# Patient Record
Sex: Male | Born: 1990 | Race: Black or African American | Hispanic: No | Smoking: Current every day smoker
Health system: Southern US, Community
[De-identification: ages and names within clinical notes are randomized; demographics above are authoritative.]

## PROBLEM LIST (undated history)

## (undated) DIAGNOSIS — F319 Bipolar disorder, unspecified: Secondary | ICD-10-CM

## (undated) DIAGNOSIS — F209 Schizophrenia, unspecified: Secondary | ICD-10-CM

## (undated) HISTORY — PX: TONSILLECTOMY: SUR1361

---

## 2022-03-17 ENCOUNTER — Emergency Department (HOSPITAL_COMMUNITY): Payer: No Typology Code available for payment source

## 2022-03-17 ENCOUNTER — Inpatient Hospital Stay (HOSPITAL_COMMUNITY)
Admission: EM | Admit: 2022-03-17 | Discharge: 2022-03-20 | DRG: 918 | Disposition: A | Discharge status: Other Acute Inpatient Hospital | Payer: No Typology Code available for payment source | Attending: Internal Medicine | Admitting: Internal Medicine

## 2022-03-17 DIAGNOSIS — T43592A Poisoning by other antipsychotics and neuroleptics, intentional self-harm, initial encounter: Secondary | ICD-10-CM | POA: Diagnosis not present

## 2022-03-17 DIAGNOSIS — E876 Hypokalemia: Secondary | ICD-10-CM | POA: Diagnosis present

## 2022-03-17 DIAGNOSIS — T50902A Poisoning by unspecified drugs, medicaments and biological substances, intentional self-harm, initial encounter: Secondary | ICD-10-CM

## 2022-03-17 DIAGNOSIS — F1910 Other psychoactive substance abuse, uncomplicated: Secondary | ICD-10-CM

## 2022-03-17 DIAGNOSIS — I959 Hypotension, unspecified: Secondary | ICD-10-CM | POA: Diagnosis present

## 2022-03-17 DIAGNOSIS — R4182 Altered mental status, unspecified: Secondary | ICD-10-CM

## 2022-03-17 DIAGNOSIS — T50901A Poisoning by unspecified drugs, medicaments and biological substances, accidental (unintentional), initial encounter: Secondary | ICD-10-CM | POA: Diagnosis not present

## 2022-03-17 DIAGNOSIS — E872 Acidosis, unspecified: Secondary | ICD-10-CM | POA: Diagnosis present

## 2022-03-17 DIAGNOSIS — Z20822 Contact with and (suspected) exposure to covid-19: Secondary | ICD-10-CM | POA: Diagnosis present

## 2022-03-17 DIAGNOSIS — F39 Unspecified mood [affective] disorder: Secondary | ICD-10-CM | POA: Diagnosis present

## 2022-03-17 DIAGNOSIS — R451 Restlessness and agitation: Secondary | ICD-10-CM | POA: Diagnosis present

## 2022-03-17 LAB — CBC WITH DIFFERENTIAL/PLATELET
Abs Immature Granulocytes: 0.02 10*3/uL (ref 0.00–0.07)
Basophils Absolute: 0 10*3/uL (ref 0.0–0.1)
Basophils Relative: 0 %
Eosinophils Absolute: 0 10*3/uL (ref 0.0–0.5)
Eosinophils Relative: 0 %
HCT: 42.9 % (ref 39.0–52.0)
Hemoglobin: 14.8 g/dL (ref 13.0–17.0)
Immature Granulocytes: 0 %
Lymphocytes Relative: 18 %
Lymphs Abs: 1.1 10*3/uL (ref 0.7–4.0)
MCH: 29.9 pg (ref 26.0–34.0)
MCHC: 34.5 g/dL (ref 30.0–36.0)
MCV: 86.7 fL (ref 80.0–100.0)
Monocytes Absolute: 0.4 10*3/uL (ref 0.1–1.0)
Monocytes Relative: 7 %
Neutro Abs: 4.4 10*3/uL (ref 1.7–7.7)
Neutrophils Relative %: 75 %
Platelets: 167 10*3/uL (ref 150–400)
RBC: 4.95 MIL/uL (ref 4.22–5.81)
RDW: 12.4 % (ref 11.5–15.5)
WBC: 6 10*3/uL (ref 4.0–10.5)
nRBC: 0 % (ref 0.0–0.2)

## 2022-03-17 LAB — I-STAT VENOUS BLOOD GAS, ED
Acid-base deficit: 4 mmol/L — ABNORMAL HIGH (ref 0.0–2.0)
Bicarbonate: 19.3 mmol/L — ABNORMAL LOW (ref 20.0–28.0)
Calcium, Ion: 1.13 mmol/L — ABNORMAL LOW (ref 1.15–1.40)
HCT: 45 % (ref 39.0–52.0)
Hemoglobin: 15.3 g/dL (ref 13.0–17.0)
O2 Saturation: 98 %
Potassium: 3.2 mmol/L — ABNORMAL LOW (ref 3.5–5.1)
Sodium: 140 mmol/L (ref 135–145)
TCO2: 20 mmol/L — ABNORMAL LOW (ref 22–32)
pCO2, Ven: 31.5 mmHg — ABNORMAL LOW (ref 44–60)
pH, Ven: 7.396 (ref 7.25–7.43)
pO2, Ven: 103 mmHg — ABNORMAL HIGH (ref 32–45)

## 2022-03-17 LAB — MAGNESIUM: Magnesium: 1.5 mg/dL — ABNORMAL LOW (ref 1.7–2.4)

## 2022-03-17 LAB — ACETAMINOPHEN LEVEL: Acetaminophen (Tylenol), Serum: <10 ug/mL — ABNORMAL LOW (ref 10–30)

## 2022-03-17 LAB — LACTIC ACID, PLASMA: Lactic Acid, Venous: 1.9 mmol/L (ref 0.5–1.9)

## 2022-03-17 LAB — ETHANOL: Alcohol, Ethyl (B): <10 mg/dL (ref <10)

## 2022-03-17 LAB — CBG MONITORING, ED: Glucose-Capillary: 147 mg/dL — ABNORMAL HIGH (ref 70–99)

## 2022-03-17 LAB — SALICYLATE LEVEL: Salicylate Lvl: <7 mg/dL — ABNORMAL LOW (ref 7.0–30.0)

## 2022-03-17 MED ORDER — SODIUM CHLORIDE 0.9 % IV BOLUS
1000.0000 mL | Freq: Once | INTRAVENOUS | Status: AC
  Administered 2022-03-17: 1000 mL via INTRAVENOUS

## 2022-03-17 MED ORDER — NOREPINEPHRINE 4 MG/250ML-% IV SOLN
0.0000 ug/min | INTRAVENOUS | Status: DC
  Administered 2022-03-17: 1.067 ug/min via INTRAVENOUS
  Filled 2022-03-17: qty 250

## 2022-03-17 MED ORDER — MAGNESIUM SULFATE 2 GM/50ML IV SOLN
2.0000 g | Freq: Once | INTRAVENOUS | Status: AC
  Administered 2022-03-17: 2 g via INTRAVENOUS
  Filled 2022-03-17: qty 50

## 2022-03-17 MED ORDER — LORAZEPAM 2 MG/ML IJ SOLN
1.0000 mg | Freq: Once | INTRAMUSCULAR | Status: AC
  Administered 2022-03-17: 1 mg via INTRAVENOUS
  Filled 2022-03-17: qty 1

## 2022-03-17 MED ORDER — SODIUM CHLORIDE 0.9 % IV BOLUS
1000.0000 mL | Freq: Once | INTRAVENOUS | Status: AC
Start: 2022-03-17 — End: 2022-03-18
  Administered 2022-03-17: 1000 mL via INTRAVENOUS

## 2022-03-17 NOTE — ED Triage Notes (Signed)
Pt arrived via [facility] for ingestion/overdose with intent from [place]. Pt called EMS after taking 12 Quetiapine 200 mg tablets with the intent to kill himself. Pt was hypotensive, diaphoretic, tachycardic and altered with intermittent unconsciousness. Pt denies all other drugs/mediation minus smoking marijuana earlier today.  ? ?Poison control contacted and instructed EMS to administer IVF. Pt received NS.  ? ?20g left AC ? ?Hr 140 ?BP 78/42 ?SPO2 97% ?RR 16 ?CBG 156 ? ?

## 2022-03-17 NOTE — ED Provider Notes (Signed)
?[facility] ?Provider Note ? ? ?CSN: [id number] ?Arrival date & time: 03/17/22  2205 ? ?  ? ?History ? ?Chief Complaint  ?Patient presents with  ? Drug Overdose  ? ? ?Rickey Miller is a 31 y.o. male.  Presented to the emergency room with concern for drug overdose.  Patient called out to eat, watch for ingestion.  Patient had reported taking 12 quetiapine 200 mg tablets.  Intent to harm himself.  Patient also had prazosin in his possession though denied taking that.  Hypotensive, tachycardic.  Tachycardia had improved with fluids in route but no improvement in blood pressure.  EKG concerning for sinus tachycardia versus SVT. ? ?History limited due to altered mental status. ? ?HPI ? ?  ? ?Home Medications ?Prior to Admission medications   ?Not on File  ?   ? ?Allergies    ?Patient has no allergy information on record.   ? ?Review of Systems   ?Review of Systems  ?Unable to perform ROS: Mental status change  ? ?Physical Exam ?Updated Vital Signs ?BP (!) 94/51   Pulse (!) 116   Temp (!) 97.2 ?F (36.2 ?C) (Oral)   Resp 20   SpO2 97%  ?Physical Exam ?Constitutional:   ?   Comments: Alternating between lethargic and agitated  ?HENT:  ?   Head: Normocephalic and atraumatic.  ?   Mouth/Throat:  ?   Mouth: Mucous membranes are moist.  ?Eyes:  ?   Extraocular Movements: Extraocular movements intact.  ?   Pupils: Pupils are equal, round, and reactive to light.  ?Cardiovascular:  ?   Rate and Rhythm: Tachycardia present.  ?Pulmonary:  ?   Effort: Pulmonary effort is normal. No respiratory distress.  ?Abdominal:  ?   General: Abdomen is flat.  ?   Palpations: There is no mass.  ?   Tenderness: There is no abdominal tenderness.  ?Musculoskeletal:     ?   General: No deformity or signs of injury.  ?   Cervical back: Normal range of motion.  ?Skin: ?   General: Skin is warm.  ?   Comments: Mildly diaphoretic  ?Neurological:  ?   Comments: Intermittently lethargic and intermittently  agitated, some confused verbal response, he opens eyes spontaneously, moves all 4 extremities  ?Psychiatric:  ?   Comments: Agitated  ? ? ?ED Results / Procedures / Treatments   ?Labs ?(all labs ordered are listed, but only abnormal results are displayed) ?Labs Reviewed  ?I-STAT VENOUS BLOOD GAS, ED - Abnormal; Notable for the following components:  ?    Result Value  ? pCO2, Ven 31.5 (*)   ? pO2, Ven 103 (*)   ? Bicarbonate 19.3 (*)   ? TCO2 20 (*)   ? Acid-base deficit 4.0 (*)   ? Potassium 3.2 (*)   ? Calcium, Ion 1.13 (*)   ? All other components within normal limits  ?CBG MONITORING, ED - Abnormal; Notable for the following components:  ? Glucose-Capillary 147 (*)   ? All other components within normal limits  ?LACTIC ACID, PLASMA  ?LACTIC ACID, PLASMA  ?ACETAMINOPHEN LEVEL  ?SALICYLATE LEVEL  ?RAPID URINE DRUG SCREEN, HOSP PERFORMED  ?URINALYSIS, ROUTINE W REFLEX MICROSCOPIC  ?MAGNESIUM  ?ETHANOL  ?CBC WITH DIFFERENTIAL/PLATELET  ?CBC  ? ? ?EKG ?EKG Interpretation ? ?Date/Time:  Saturday March 17 2022 22:12:30 EDT ?Ventricular Rate:  124 ?PR Interval:  202 ?QRS Duration: 107 ?QT Interval:  365 ?QTC Calculation: 525 ?R Axis:   110 ?  Text Interpretation: Sinus tachycardia Borderline prolonged PR interval Consider right atrial enlargement Low voltage, precordial leads Probable right ventricular hypertrophy Nonspecific T abnormalities, anterior leads Prolonged QT interval Confirmed by Marianna Fuss ([id number]) on 03/17/2022 10:45:07 PM ? ?Radiology ?DG Chest Portable 1 View ? ?Result Date: 03/17/2022 ?CLINICAL DATA:  Hypotension EXAM: PORTABLE CHEST 1 VIEW COMPARISON:  None. FINDINGS: The heart size and mediastinal contours are within normal limits. Both lungs are clear. The visualized skeletal structures are unremarkable. IMPRESSION: No active disease. Electronically Signed   By: Sharlet Salina M.D.   On: 03/17/2022 22:28   ? ?Procedures ?[facility] KitchenCritical Care ?Performed by: Milagros Loll, MD ?Authorized by: Milagros Loll, MD  ? ?Critical care provider statement:  ?  Critical care time (minutes):  46 ?  Critical care was necessary to treat or prevent imminent or life-threatening deterioration of the following conditions:  Cardiac failure and CNS failure or compromise ?  Critical care was time spent personally by me on the following activities:  Development of treatment plan with patient or surrogate, discussions with consultants, evaluation of patient's response to treatment, examination of patient, ordering and review of laboratory studies, ordering and review of radiographic studies, ordering and performing treatments and interventions, pulse oximetry, re-evaluation of patient's condition and review of old charts  ? ? ?Medications Ordered in ED ?Medications  ?norepinephrine (LEVOPHED) 4mg  in (0.016 mg/mL) premix infusion (1.067 mcg/min Intravenous New Bag/Given 03/17/22 2224)  ?magnesium sulfate IVPB 2 g 50 mL (2 g Intravenous New Bag/Given 03/17/22 2228)  ?sodium chloride 0.9 % bolus 1,000 mL (has no administration in time range)  ?sodium chloride 0.9 % bolus 1,000 mL (has no administration in time range)  ?sodium chloride 0.9 % bolus 1,000 mL (1,000 mLs Intravenous New Bag/Given 03/17/22 2217)  ?LORazepam (ATIVAN) injection 1 mg (1 mg Intravenous Given 03/17/22 2226)  ? ? ?ED Course/ Medical Decision Making/ A&P ?  ?                        ?Medical Decision Making ?Amount and/or Complexity of Data Reviewed ?Labs: ordered. ?Radiology: ordered. ?ECG/medicine tests: ordered. ? ?Risk ?Prescription drug management. ?Decision regarding hospitalization. ? ? ?31 year old presenting to ER from [facility] due to concern for quetiapine overdose.  Also had prazosin in position.  On arrival to ER patient tachycardic, hypotensive.  EKG with sinus tachycardia, prolonged QT.  I discussed the case with poison control, they advise supportive measures, fluid resuscitation, pressor support if needed.  They do not recommend charcoal at  this time.  Provided IV mag due to prolonged QT.  Provided 2 L fluid bolus in addition to the 500 mL fluids given by EMS.  Patient's blood pressure was not responding to the initial fluid bolus and initiated low-dose Levophed for pressor support.  Patient is alternating between being somewhat lethargic and very agitated.  Provided IV Ativan. ? ?Initial labs reviewed, slightly low bicarbonate level, CBG okay.  Independently reviewed CXR, no acute findings. ? ?On reassessment, BP has improved, asked RN to pause Levophed.  BP remained stable without Levophed.  While awaiting additional labs, reassessment, signed out to [facility].  If patient has any further hypotension and requires additional pressor support or if there is deterioration of mental status, may require ICU admission.  If not, potentially appropriate for medicine service. ? ? ? ? ? ? ?Final Clinical Impression(s) / ED Diagnoses ?Final diagnoses:  ?Intentional overdose, initial encounter [facility])  ?Hypotension, unspecified hypotension  type  ?Altered mental status, unspecified altered mental status type  ? ? ?Rx / DC Orders ?ED Discharge Orders   ? ? None  ? ?  ? ? ?  ?Milagros Loll, MD ?03/17/22 2312 ? ?

## 2022-03-18 ENCOUNTER — Emergency Department (HOSPITAL_COMMUNITY): Payer: No Typology Code available for payment source

## 2022-03-18 DIAGNOSIS — E872 Acidosis, unspecified: Secondary | ICD-10-CM

## 2022-03-18 DIAGNOSIS — T43592A Poisoning by other antipsychotics and neuroleptics, intentional self-harm, initial encounter: Secondary | ICD-10-CM | POA: Diagnosis not present

## 2022-03-18 DIAGNOSIS — T50902A Poisoning by unspecified drugs, medicaments and biological substances, intentional self-harm, initial encounter: Secondary | ICD-10-CM

## 2022-03-18 DIAGNOSIS — E876 Hypokalemia: Secondary | ICD-10-CM | POA: Diagnosis not present

## 2022-03-18 DIAGNOSIS — I959 Hypotension, unspecified: Secondary | ICD-10-CM | POA: Diagnosis not present

## 2022-03-18 DIAGNOSIS — R451 Restlessness and agitation: Secondary | ICD-10-CM | POA: Diagnosis not present

## 2022-03-18 DIAGNOSIS — Z20822 Contact with and (suspected) exposure to covid-19: Secondary | ICD-10-CM | POA: Diagnosis not present

## 2022-03-18 DIAGNOSIS — F39 Unspecified mood [affective] disorder: Secondary | ICD-10-CM | POA: Diagnosis not present

## 2022-03-18 DIAGNOSIS — F1910 Other psychoactive substance abuse, uncomplicated: Secondary | ICD-10-CM

## 2022-03-18 DIAGNOSIS — T50901A Poisoning by unspecified drugs, medicaments and biological substances, accidental (unintentional), initial encounter: Secondary | ICD-10-CM | POA: Diagnosis present

## 2022-03-18 LAB — BASIC METABOLIC PANEL
Anion gap: 6 (ref 5–15)
BUN: 8 mg/dL (ref 6–20)
CO2: 20 mmol/L — ABNORMAL LOW (ref 22–32)
Calcium: 8.2 mg/dL — ABNORMAL LOW (ref 8.9–10.3)
Chloride: 114 mmol/L — ABNORMAL HIGH (ref 98–111)
Creatinine, Ser: 1.05 mg/dL (ref 0.61–1.24)
GFR, Estimated: >60 mL/min (ref >60)
Glucose, Bld: 90 mg/dL (ref 70–99)
Potassium: 3.8 mmol/L (ref 3.5–5.1)
Sodium: 140 mmol/L (ref 135–145)

## 2022-03-18 LAB — COMPREHENSIVE METABOLIC PANEL
ALT: 17 U/L (ref 0–44)
ALT: 16 U/L (ref 0–44)
AST: 23 U/L (ref 15–41)
AST: 20 U/L (ref 15–41)
Albumin: 3.4 g/dL — ABNORMAL LOW (ref 3.5–5.0)
Albumin: 3.2 g/dL — ABNORMAL LOW (ref 3.5–5.0)
Alkaline Phosphatase: 20 U/L — ABNORMAL LOW (ref 38–126)
Alkaline Phosphatase: 18 U/L — ABNORMAL LOW (ref 38–126)
Anion gap: 12 (ref 5–15)
Anion gap: 6 (ref 5–15)
BUN: 12 mg/dL (ref 6–20)
BUN: 7 mg/dL (ref 6–20)
CO2: 19 mmol/L — ABNORMAL LOW (ref 22–32)
CO2: 20 mmol/L — ABNORMAL LOW (ref 22–32)
Calcium: 8.8 mg/dL — ABNORMAL LOW (ref 8.9–10.3)
Calcium: 8.3 mg/dL — ABNORMAL LOW (ref 8.9–10.3)
Chloride: 108 mmol/L (ref 98–111)
Chloride: 113 mmol/L — ABNORMAL HIGH (ref 98–111)
Creatinine, Ser: 1.42 mg/dL — ABNORMAL HIGH (ref 0.61–1.24)
Creatinine, Ser: 1.04 mg/dL (ref 0.61–1.24)
GFR, Estimated: >60 mL/min (ref >60)
GFR, Estimated: >60 mL/min (ref >60)
Glucose, Bld: 126 mg/dL — ABNORMAL HIGH (ref 70–99)
Glucose, Bld: 79 mg/dL (ref 70–99)
Potassium: 3.2 mmol/L — ABNORMAL LOW (ref 3.5–5.1)
Potassium: 3.8 mmol/L (ref 3.5–5.1)
Sodium: 139 mmol/L (ref 135–145)
Sodium: 139 mmol/L (ref 135–145)
Total Bilirubin: 1.1 mg/dL (ref 0.3–1.2)
Total Bilirubin: 1.3 mg/dL — ABNORMAL HIGH (ref 0.3–1.2)
Total Protein: 5.2 g/dL — ABNORMAL LOW (ref 6.5–8.1)
Total Protein: 4.8 g/dL — ABNORMAL LOW (ref 6.5–8.1)

## 2022-03-18 LAB — URINALYSIS, ROUTINE W REFLEX MICROSCOPIC
Bilirubin Urine: NEGATIVE
Glucose, UA: NEGATIVE mg/dL
Hgb urine dipstick: NEGATIVE
Ketones, ur: 20 mg/dL — AB
Leukocytes,Ua: NEGATIVE
Nitrite: NEGATIVE
Protein, ur: NEGATIVE mg/dL
Specific Gravity, Urine: 1.008 (ref 1.005–1.030)
pH: 5 (ref 5.0–8.0)

## 2022-03-18 LAB — HEPATITIS PANEL, ACUTE
HCV Ab: NONREACTIVE
Hep A IgM: NONREACTIVE
Hep B C IgM: NONREACTIVE
Hepatitis B Surface Ag: NONREACTIVE

## 2022-03-18 LAB — HIV ANTIBODY (ROUTINE TESTING W REFLEX): HIV Screen 4th Generation wRfx: NONREACTIVE

## 2022-03-18 LAB — RAPID URINE DRUG SCREEN, HOSP PERFORMED
Amphetamines: NOT DETECTED
Barbiturates: NOT DETECTED
Benzodiazepines: NOT DETECTED
Cocaine: POSITIVE — AB
Opiates: NOT DETECTED
Tetrahydrocannabinol: POSITIVE — AB

## 2022-03-18 LAB — MAGNESIUM: Magnesium: 1.9 mg/dL (ref 1.7–2.4)

## 2022-03-18 MED ORDER — SODIUM CHLORIDE 0.9 % IV SOLN: INTRAVENOUS | Status: DC

## 2022-03-18 MED ORDER — OLANZAPINE 5 MG PO TBDP
5.0000 mg | ORAL_TABLET | Freq: Two times a day (BID) | ORAL | Status: DC
  Administered 2022-03-18 – 2022-03-20 (×4): 5 mg via ORAL
  Filled 2022-03-18 (×6): qty 1

## 2022-03-18 MED ORDER — LORAZEPAM 2 MG/ML IJ SOLN
1.0000 mg | Freq: Once | INTRAMUSCULAR | Status: AC
  Administered 2022-03-18: 1 mg via INTRAVENOUS

## 2022-03-18 MED ORDER — LORAZEPAM 2 MG/ML IJ SOLN
2.0000 mg | Freq: Once | INTRAMUSCULAR | Status: AC
  Administered 2022-03-18: 2 mg via INTRAVENOUS
  Filled 2022-03-18: qty 1

## 2022-03-18 MED ORDER — POTASSIUM CHLORIDE 10 MEQ/100ML IV SOLN
10.0000 meq | INTRAVENOUS | Status: AC
  Administered 2022-03-18 (×4): 10 meq via INTRAVENOUS
  Filled 2022-03-18 (×4): qty 100

## 2022-03-18 MED ORDER — STERILE WATER FOR INJECTION IJ SOLN
INTRAMUSCULAR | Status: AC
  Filled 2022-03-18: qty 10

## 2022-03-18 MED ORDER — ONDANSETRON HCL 4 MG/2ML IJ SOLN: 4.0000 mg | Freq: Four times a day (QID) | INTRAMUSCULAR | Status: DC | PRN

## 2022-03-18 MED ORDER — ENOXAPARIN SODIUM 40 MG/0.4ML IJ SOSY
40.0000 mg | PREFILLED_SYRINGE | INTRAMUSCULAR | Status: DC
  Administered 2022-03-18 – 2022-03-20 (×3): 40 mg via SUBCUTANEOUS
  Filled 2022-03-18 (×3): qty 0.4

## 2022-03-18 MED ORDER — LORAZEPAM 2 MG/ML IJ SOLN
INTRAMUSCULAR | Status: AC
  Filled 2022-03-18: qty 1

## 2022-03-18 MED ORDER — LORAZEPAM 2 MG/ML IJ SOLN
1.0000 mg | INTRAMUSCULAR | Status: DC | PRN
  Administered 2022-03-18: 1 mg via INTRAVENOUS
  Filled 2022-03-18: qty 1

## 2022-03-18 MED ORDER — ZIPRASIDONE MESYLATE 20 MG IM SOLR
10.0000 mg | Freq: Once | INTRAMUSCULAR | Status: AC
  Administered 2022-03-18: 10 mg via INTRAMUSCULAR
  Filled 2022-03-18: qty 20

## 2022-03-18 MED ORDER — ACETAMINOPHEN 325 MG PO TABS
650.0000 mg | ORAL_TABLET | Freq: Four times a day (QID) | ORAL | Status: DC | PRN
Start: 2022-03-18 — End: 2022-03-20

## 2022-03-18 MED ORDER — ALUM & MAG HYDROXIDE-SIMETH 200-200-20 MG/5ML PO SUSP: 30.0000 mL | Freq: Four times a day (QID) | ORAL | Status: DC | PRN

## 2022-03-18 NOTE — ED Notes (Signed)
IVC paper received. 

## 2022-03-18 NOTE — Progress Notes (Signed)
? Rickey Miller  GDJ:[id number] DOB: 1990-12-12 DOA: 03/17/2022 ?PCP: Pcp, No   ? ?Brief Narrative:  ?31 year old with a history of mood disorder who presented to the ED via EMS from a hotel after he called EMS reporting having taken twelve 200 mg quetiapine as a suicidal gesture.  EMS found him hypotensive and tachycardic.  This persisted upon arrival in the ED.  EKG revealed sinus tachycardia with prolonged QT.  He was volume resuscitated with 3 L and provided empiric magnesium.  Blood pressure remained low and he required low-dose Levophed transiently.  With subsequent volume expansion blood pressure did improve and Levophed was able to be stopped.  The patient was noted to be quite agitated at times and required Ativan. ? ?Consultants:  ?Psychiatry ? ?Code Status: FULL CODE ? ?DVT prophylaxis: ?Lovenox ? ?Interim Hx: ?Afebrile.  Vital signs stable.  Heart rate stable at 70.  Saturation 100% room air.  During the course of the day patient has become very agitated and has attempted to leave the hospital AMA.  ED physician was nice enough to intervene and submit IVC paperwork.  We are awaiting a psychiatric evaluation for ultimate disposition.  With normalization of the patient's QTc on his EKG he is medically cleared for discharge as soon as a psychiatric disposition can be finalized.  He is not however safe to leave the hospital until cleared for same by psychiatry as he did express clear suicidal intention at the time of his presentation. ? ?Assessment & Plan: ? ?Intentional toxic quetiapine ingestion ?hypotension resolved -QT interval has improved on follow-up EKG -now medically cleared from this diagnosis ? ?Suicidal gesture ?Psychiatry to see to determine ultimate psychiatric disposition - IVC necessary due to attempts to leave the hospital ? ?Hypokalemia ?Corrected with supplementation ? ?Hypomagnesemia ?Has been supplemented ? ?Family Communication:  ?Disposition:  ? ?Objective: ?Blood pressure (!)  130/93, pulse 70, temperature (!) 97.5 ?F (36.4 ?C), temperature source Oral, resp. rate 13, SpO2 100 %. ?No intake or output data in the 24 hours ending 03/18/22 0806 ?There were no vitals filed for this visit. ? ?Examination: ?Patient was examined by one of my partners earlier today.  Given his acute agitation I chose not to reexamine him this afternoon. ? ?CBC: ?Recent Labs  ?Lab 03/17/22 ?2225 03/17/22 ?2229  ?WBC 6.0  --   ?NEUTROABS 4.4  --   ?HGB 14.8 15.3  ?HCT 42.9 45.0  ?MCV 86.7  --   ?PLT 167  --   ? ?Basic Metabolic Panel: ?Recent Labs  ?Lab 03/17/22 ?2225 03/17/22 ?2229 03/18/22 ?0533  ?NA 139 140 140  ?K 3.2* 3.2* 3.8  ?CL 108  --  114*  ?CO2 19*  --  20*  ?GLUCOSE 126*  --  90  ?BUN 12  --  8  ?CREATININE 1.42*  --  1.05  ?CALCIUM 8.8*  --  8.2*  ?MG  --  1.5*  --   ? ?GFR: ?CrCl cannot be calculated (Unknown ideal weight.). ? ?Liver Function Tests: ?Recent Labs  ?Lab 03/17/22 ?2225  ?AST 23  ?ALT 17  ?ALKPHOS 20*  ?BILITOT 1.1  ?PROT 5.2*  ?ALBUMIN 3.4*  ? ?CBG: ?Recent Labs  ?Lab 03/17/22 ?2213  ?GLUCAP 147*  ? ? ?Scheduled Meds: ? enoxaparin (LOVENOX) injection  40 mg Subcutaneous Q24H  ? ?Continuous Infusions: ? sodium chloride 150 mL/hr at 03/18/22 0420  ? ? ? LOS: 0 days  ? ?Lonia Blood, MD ?[facility] ?Office  [phone] ?Pager - Text Page  per Amion ? ?If 7PM-7AM, please contact night-coverage per Amion ?03/18/2022, 8:06 AM ? ? ? ? ?

## 2022-03-18 NOTE — ED Notes (Signed)
Pt is started to get aggitated, stating that his wife is here going to get his belongings so he can leave and he also states that someone is in the room with him.  ?

## 2022-03-18 NOTE — ED Notes (Signed)
Gave and documented ativan on this patient was removed under another patient. ?Called and made pharmacy aware. ?

## 2022-03-18 NOTE — ED Notes (Signed)
Pt is more awake at this time. Still has some slurred speech but can be understood. Pt denies is SI right now but did admit that he was trying to hurt himself. Pt will not state why he attempted to harm himself.  ?

## 2022-03-18 NOTE — ED Notes (Signed)
Attempted to perform EKG on pt, pt was asleep and woke up to say "he didn't want to do the EKG at this time", will try again later.  ?

## 2022-03-18 NOTE — Progress Notes (Signed)
Provider contacted due to Poison control recommended repeat EKG due to antipsychotics being given. EKG ordered and obtained. Attempted x 2 to get it to muse to epic. Was not successful. Pt started removing leads and was unable to successfully get to muse to epic. Provider aware NSR ekg and results and QT QTC.  ?

## 2022-03-18 NOTE — ED Notes (Signed)
Received pt. Pt is noted to be sleeping with even, regular respirations with slight snore. It does take a little effort to arose the pt.  ?

## 2022-03-18 NOTE — Progress Notes (Signed)
Rickey Miller  ?Code Status: FULL ?Rickey Miller is a 31 y.o. male patient admitted from ED alert but lethargic. Stated his name but refused other orientation questions at this time. Cardiac tele in place. ?Refused two-nurse skin assessment. When asked questions, patient pulls blanket over face and does not interact. Suicide sitter at bedside.  ??  ?Will cont to eval and treat per MD orders.  ?Jon Gills, RN  ?03/18/2022 ?

## 2022-03-18 NOTE — TOC Initial Note (Signed)
Transition of Care (TOC) - Initial/Assessment Note  ? ? ?Patient Details  ?Name: Rickey Miller ?MRN: [id number] ?Date of Birth: [birth year] ? ?Transition of Care (TOC) CM/SW Contact:    ?Lockie Pares, RN ?Phone Number: ?03/18/2022, 10:14 AM ? ?Clinical Narrative:                 ?Transition of Care Department [facility]) has reviewed patient and no TOC needs have been identified at this time. We will continue to monitor patient advancement through interdisciplinary progression rounds. If new patient transition needs arise, please place a TOC consult. ?  ?  ? ?  ?  ? ? ?Patient Goals and CMS Choice ?  ?  ?  ? ?Expected Discharge Plan and Services ?  ?  ?  ?  ?  ?                ?  ?  ?  ?  ?  ?  ?  ?  ?  ?  ? ?Prior Living Arrangements/Services ?  ?  ?  ?       ?  ?  ?  ?  ? ?Activities of Daily Living ?  ?  ? ?Permission Sought/Granted ?  ?  ?   ?   ?   ?   ? ?Emotional Assessment ?  ?  ?  ?  ?  ?  ? ?Admission diagnosis:  Drug overdose [T50.901A] ?Patient Active Problem List  ? Diagnosis Date Noted  ? Intentional drug overdose (HCC) 03/18/2022  ? Polysubstance abuse (HCC) 03/18/2022  ? Hypokalemia 03/18/2022  ? Hypomagnesemia 03/18/2022  ? Metabolic acidosis 03/18/2022  ? ?PCP:  Pcp, No ?Pharmacy:   ?[facility] [place], [place] - [address] ?[address] ?[place] [address] ?Phone: [phone] Fax: [phone] ? ? ? ? ?Social Determinants of Health (SDOH) Interventions ?  ? ?Readmission Risk Interventions ?   ? View : No data to display.  ?  ?  ?  ? ? ? ?

## 2022-03-18 NOTE — H&P (Signed)
?History and Physical  ? ? ?Rickey Miller KKX:[id number] DOB: 04-Mar-1991 DOA: 03/17/2022 ? ?PCP: Pcp, No ? ?Patient coming from:  [facility] ? ?Chief Complaint: Altered mental status ? ?HPI: Rickey Miller is a 31 y.o. male with a past medical history of mood disorder presented to the ED via EMS from a hotel for concern of drug overdose.  Patient called EMS after taking 12 quetiapine 200 mg tablets with intent to harm himself.  He also had prazosin in his position but denied taking it.  He was hypotensive and tachycardic with EMS and was given IV fluids in route.  On arrival to the ED, patient continued to be tachycardic and hypotensive.  EKG showing sinus tachycardia with prolonged QT.  ED physician discussed the case with poison control, recommended supportive measures including fluid resuscitation and vasopressor support if needed.  Patient was given IV magnesium due to prolonged QT.  He was given 3 L fluid boluses in addition to 250 cc given by EMS.  Blood pressure did not respond to initial IV fluid bolus and was started on low-dose Levophed.  However, after he received additional boluses his blood pressure improved and Levophed was stopped.  Patient alternated between being lethargic to very agitated and trying to hit the bedside sitter.  He was given 2 doses of Ativan. ? ?Labs notable for potassium 3.2, bicarb 19, anion gap 12, blood glucose 126, creatinine 1.4, magnesium 1.5, lactic acid normal.  Blood ethanol, acetaminophen, and salicylate levels undetectable.  UDS positive for cocaine and THC.  VBG with pH 7.39, PCO2 31.5.  Chest x-ray showing no active disease.  CT head negative for acute finding.  ? ?Patient is currently very somnolent and not able to give any history. ? ?Review of Systems:  ?Review of Systems  ?Reason unable to perform ROS: Altered mental status.  ? ?Past medical history: See HPI. ? ?Past surgical history, history of allergies, social history, family history: Unable to obtain at this time  due to patient's altered mental status. ? ?Prior to Admission medications   ?Medication Sig Start Date End Date Taking? Authorizing Provider  ?prazosin (MINIPRESS) 1 MG capsule Take 1 mg by mouth at bedtime.   Yes [provider]  ?QUEtiapine (SEROQUEL) 200 MG tablet Take 200 mg by mouth 2 (two) times daily. 01/10/22  Yes [provider]  ? ? ?Physical Exam: ?Vitals:  ? 03/18/22 0000 03/18/22 0015 03/18/22 0030 03/18/22 0130  ?BP: 115/64 119/66 130/82 128/72  ?Pulse: 98 96 (!) 105 87  ?Resp: 19 19 17 15   ?Temp:      ?TempSrc:      ?SpO2: 97% 96% 96% 99%  ? ? ?Physical Exam ?Vitals reviewed.  ?Constitutional:   ?   General: He is not in acute distress. ?   Appearance: He is not diaphoretic.  ?HENT:  ?   Head: Normocephalic and atraumatic.  ?Eyes:  ?   Pupils: Pupils are equal, round, and reactive to light.  ?Cardiovascular:  ?   Rate and Rhythm: Normal rate and regular rhythm.  ?   Pulses: Normal pulses.  ?Pulmonary:  ?   Effort: Pulmonary effort is normal. No respiratory distress.  ?   Breath sounds: Normal breath sounds. No wheezing or rales.  ?Abdominal:  ?   General: Bowel sounds are normal. There is no distension.  ?   Palpations: Abdomen is soft.  ?   Tenderness: There is no abdominal tenderness.  ?Musculoskeletal:     ?   General: No  swelling or tenderness.  ?   Cervical back: Normal range of motion. No rigidity.  ?Skin: ?   General: Skin is warm and dry.  ?Neurological:  ?   Comments: Very somnolent but intermittently opening his eyes and moving all extremities spontaneously  ?  ? ?Labs on Admission: I have personally reviewed following labs and imaging studies ? ?CBC: ?Recent Labs  ?Lab 03/17/22 ?2225 03/17/22 ?2229  ?WBC 6.0  --   ?NEUTROABS 4.4  --   ?HGB 14.8 15.3  ?HCT 42.9 45.0  ?MCV 86.7  --   ?PLT 167  --   ? ?Basic Metabolic Panel: ?Recent Labs  ?Lab 03/17/22 ?2225 03/17/22 ?2229  ?NA 139 140  ?K 3.2* 3.2*  ?CL 108  --   ?CO2 19*  --   ?GLUCOSE 126*  --   ?BUN 12  --   ?CREATININE  1.42*  --   ?CALCIUM 8.8*  --   ?MG  --  1.5*  ? ?GFR: ?CrCl cannot be calculated (Unknown ideal weight.). ?Liver Function Tests: ?Recent Labs  ?Lab 03/17/22 ?2225  ?AST 23  ?ALT 17  ?ALKPHOS 20*  ?BILITOT 1.1  ?PROT 5.2*  ?ALBUMIN 3.4*  ? ?No results for input(s): LIPASE, AMYLASE in the last 168 hours. ?No results for input(s): AMMONIA in the last 168 hours. ?Coagulation Profile: ?No results for input(s): INR, PROTIME in the last 168 hours. ?Cardiac Enzymes: ?No results for input(s): CKTOTAL, CKMB, CKMBINDEX, TROPONINI in the last 168 hours. ?BNP (last 3 results) ?No results for input(s): PROBNP in the last 8760 hours. ?HbA1C: ?No results for input(s): HGBA1C in the last 72 hours. ?CBG: ?Recent Labs  ?Lab 03/17/22 ?2213  ?GLUCAP 147*  ? ?Lipid Profile: ?No results for input(s): CHOL, HDL, LDLCALC, TRIG, CHOLHDL, LDLDIRECT in the last 72 hours. ?Thyroid Function Tests: ?No results for input(s): TSH, T4TOTAL, FREET4, T3FREE, THYROIDAB in the last 72 hours. ?Anemia Panel: ?No results for input(s): VITAMINB12, FOLATE, FERRITIN, TIBC, IRON, RETICCTPCT in the last 72 hours. ?Urine analysis: ?   ?Component Value Date/Time  ? COLORURINE YELLOW 03/18/2022 0030  ? APPEARANCEUR CLEAR 03/18/2022 0030  ? LABSPEC 1.008 03/18/2022 0030  ? PHURINE 5.0 03/18/2022 0030  ? GLUCOSEU NEGATIVE 03/18/2022 0030  ? HGBUR NEGATIVE 03/18/2022 0030  ? BILIRUBINUR NEGATIVE 03/18/2022 0030  ? KETONESUR 20 (A) 03/18/2022 0030  ? PROTEINUR NEGATIVE 03/18/2022 0030  ? NITRITE NEGATIVE 03/18/2022 0030  ? LEUKOCYTESUR NEGATIVE 03/18/2022 0030  ? ? ?Radiological Exams on Admission: I have personally reviewed images ?CT Head Wo Contrast ? ?Result Date: 03/18/2022 ?CLINICAL DATA:  Mental status change, unknown cause EXAM: CT HEAD WITHOUT CONTRAST TECHNIQUE: Contiguous axial images were obtained from the base of the skull through the vertex without intravenous contrast. RADIATION DOSE REDUCTION: This exam was performed according to the departmental  dose-optimization program which includes automated exposure control, adjustment of the mA and/or kV according to patient size and/or use of iterative reconstruction technique. COMPARISON:  None. FINDINGS: Brain: No evidence of large-territorial acute infarction. No parenchymal hemorrhage. No mass lesion. No extra-axial collection. No mass effect or midline shift. No hydrocephalus. Basilar cisterns are patent. Vascular: No hyperdense vessel. Skull: No acute fracture or focal lesion. Sinuses/Orbits: Paranasal sinuses and mastoid air cells are clear. The orbits are unremarkable. Other: None. IMPRESSION: No acute intracranial abnormality. Electronically Signed   By: Tish FredericksonMorgane  Naveau M.D.   On: 03/18/2022 00:58  ? ?DG Chest Portable 1 View ? ?Result Date: 03/17/2022 ?CLINICAL DATA:  Hypotension EXAM: PORTABLE CHEST 1  VIEW COMPARISON:  None. FINDINGS: The heart size and mediastinal contours are within normal limits. Both lungs are clear. The visualized skeletal structures are unremarkable. IMPRESSION: No active disease. Electronically Signed   By: Sharlet Salina M.D.   On: 03/17/2022 22:28   ? ?EKG: Independently reviewed.  Initial EKG showing sinus tachycardia with QT prolongation (QTc 525).  Repeat EKG showing improvement of QT interval. ? ?Assessment and Plan: ? ?Intentional drug overdose/suicide attempt ?Polysubstance abuse ?Patient took 12 quetiapine 200 mg tablets with intent to harm himself. Poison control recommended supportive measures including IV fluid hydration and vasopressor support as needed. Initially hypotensive and tachycardic, did not respond to IV fluids and required Levophed for hypotension which is now off as blood pressure improved after patient was given additional fluid boluses. Initial EKG with QT prolongation and was given IV magnesium after which repeat EKG revealed improvement of QT interval.  UDS positive for cocaine and THC.  Patient alternated between being lethargic to very agitated and  trying to hit the bedside sitter.  He was given 2 doses of Ativan in the ED and currently very somnolent.  Vital signs currently stable, not hypoxic. ?-Hold quetiapine ?-Psychiatry consulted ?-Suicide precaution

## 2022-03-18 NOTE — ED Notes (Signed)
Pt with multiple attempts in the last 30 minutes trying to leave. ED MD IVC'd pt.  ?

## 2022-03-18 NOTE — ED Notes (Signed)
Sitter called this rn to the room. Pt noted to slap the sitter. Security to room ?

## 2022-03-18 NOTE — ED Provider Notes (Signed)
Care of the patient assumed at the change of shift. Here for intentional drug overdose of quetiapine. Arrived hypotensive and tachycardic with prolonged QT, labile mental status, has received ativan. Awaiting labs, Initially on levophed which has been weaned off.  ?Physical Exam  ?BP 119/66   Pulse 96   Temp (!) 97.2 ?F (36.2 ?C) (Oral)   Resp 19   SpO2 96%  ? ?Physical Exam ?Asleep ?Pupils equal ?Respirations unlabored ?Regular Rate ?Good color ?Intermittently awake and sitting up, disoriented ?Procedures  ?Procedures ? ?ED Course / MDM  ? ?Clinical Course as of 03/18/22 0224  ?Sun Mar 18, 2022  ?0032 CBC is unremarkable, ASA, APAP and EtOH are neg. Chemistry panel does not appear to have been ordered as part of the initial workup, will add that now. BP and HR improving.  [CS]  ?0111 I personally viewed the images from radiology studies and agree with radiologist interpretation: CT neg ?I spoke with Dr. Maisie Fus, Hospitalist who will come evaluate the patient.  ? [CS]  ?0159 Patient sitting up, trying to get out of bed and swinging fist at sitter. He was noted to have some truncal ataxia while trying to sit, drooling on the floor. Assisted back to bed, additional Ativan ordered.  [CS]  ?  ?Clinical Course User Index ?[CS] Pollyann Savoy, MD  ? ?Medical Decision Making ?Problems Addressed: ?Altered mental status, unspecified altered mental status type: acute illness or injury ?Hypotension, unspecified hypotension type: acute illness or injury ?Intentional overdose, initial encounter [facility]): acute illness or injury ? ?Amount and/or Complexity of Data Reviewed ?Labs: ordered. Decision-making details documented in ED Course. ?Radiology: ordered and independent interpretation performed. Decision-making details documented in ED Course. ?ECG/medicine tests: ordered and independent interpretation performed. Decision-making details documented in ED Course. ? ?Risk ?Prescription drug management. ?Decision regarding  hospitalization. ? ?Critical Care ?Total time providing critical care: 40 minutes ? ? ? ? ? ? ?  ?Pollyann Savoy, MD ?03/18/22 0131 ? ?

## 2022-03-18 NOTE — ED Notes (Signed)
This RN called into room after incident with pt and pt sitter. Pt sitter states that pt was trying to get up from the bed and she was trying to redirect him and he was pushing at her to get off of him. Pt got up from the bed and was standing at the end of the bed trying to leave but still attached to multiple thing. Pt ripped out IV in L FA. Another nurse was in the room during this episode. Pt did go down on one knee. No injury noted to the knee by this nurse and pt was able to get up by himself.  ?

## 2022-03-18 NOTE — ED Provider Notes (Signed)
Emergency Medicine Observation Re-evaluation Note ? ?Rickey Miller is a 31 y.o. male, seen on rounds today.  Pt initially presented to the ED for complaints of Drug Overdose ?Currently, the patient is agitated, trying to leave, minimally redirectable. ? ?Physical Exam  ?BP (!) 135/96   Pulse 72   Temp (!) 97.5 ?F (36.4 ?C) (Oral)   Resp 19   SpO2 100%  ?Physical Exam ?General: No distress ?Cardiac: Regular rate and rhythm ?Lungs: No increased work of breathing ?Psych: Not insightful ? ?ED Course / MDM  ?EKG:EKG Interpretation ? ?Date/Time:  Sunday March 18 2022 53:64:68 EDT ?Ventricular Rate:  104 ?PR Interval:  187 ?QRS Duration: 96 ?QT Interval:  343 ?QTC Calculation: 452 ?R Axis:   96 ?Text Interpretation: Sinus tachycardia Biatrial enlargement Consider right ventricular hypertrophy Since last tracing Rate slower QT has shortened Confirmed by Susy Frizzle [phone]) on 03/18/2022 12:36:34 AM ? ?I have reviewed the labs performed to date as well as medications administered while in observation.  Recent changes in the last 24 hours include trying to leave. ? ?Plan  ?Current plan is for involuntary commitment executed by myself, discussed at bedside with our psychiatry attending.  Additional med rec's including Zyprexa, 5 mg, twice daily ordered, Ativan IM ordered. ?Nelson Wert is under involuntary commitment. ?  ? ?  ?Gerhard Munch, MD ?03/18/22 1327 ? ?

## 2022-03-18 NOTE — ED Notes (Signed)
Pt belongings locked in purple locker 1, valuables given to security, medications sent to pharmacy.   ?

## 2022-03-18 NOTE — ED Notes (Signed)
Poison control called for update. Would like a new EKG d/t QT being a little long earlier.  ?

## 2022-03-18 NOTE — Consult Note (Signed)
[facility] Face-to-Face Psychiatry Consult  ? ?Reason for Consult: suicide attempt  ?Referring Physician:  Cherene Altes, MD ?Patient Identification: Rickey Miller ?MRN:  [id number] ?Principal Diagnosis: Intentional drug overdose ([place]) ?Diagnosis:  Principal Problem: ?  Intentional drug overdose ([place]) ?Active Problems: ?  Polysubstance abuse ([place]) ?  Hypokalemia ?  Hypomagnesemia ?  Metabolic acidosis ? ? ?Total Time spent with patient: 15 minutes ? ?Subjective:   ?Rickey Miller is a 31 y.o. male patient admitted  to [facility] via EMS from a hotel after he called EMS reporting having taken twelve 200 mg quetiapine as a suicidal gesture.   ? ?HPI: Patient seen face to face by this provider, along with Dr. Dwyane Dee and chart reviewed on 03/18/22. On evaluation Rickey Miller is observed leaving his room as he attempted to elope from the ED. He was redirected by staff back to his room to complete the psychiatric evaluation. During the evaluation, he is sitting on the edge of the bed with his head held down trying to pick something off of his gown. He was asked how much Seroquel did he take and his response was "I don't remember." Patient appeared to be responding to external stimuli, easily distracted and unable to participate in the psych assessment. Per nursing, patient has been physically aggressive, agitated and trying to elope.  ? ?Past Psychiatric History: Unknown  ? ?Risk to Self:  yes  ?Risk to Others:  no  ?Prior Inpatient Therapy:  unknown ?Prior Outpatient Therapy:  unknown  ? ?Past Medical History: per notes, hx of mood dx. ?Family History: No family history on file. ?Family Psychiatric  History: UTA ?Social History:  ?Social History  ? ?Substance and Sexual Activity  ?Alcohol Use Not on file  ?   ?Social History  ? ?Substance and Sexual Activity  ?Drug Use Not on file  ?  ?Social History  ? ?Socioeconomic History  ? Marital status: Unknown  ?  Spouse name: Not on file  ? Number of children: Not on file  ?  Years of education: Not on file  ? Highest education level: Not on file  ?Occupational History  ? Not on file  ?Tobacco Use  ? Smoking status: Not on file  ? Smokeless tobacco: Not on file  ?Substance and Sexual Activity  ? Alcohol use: Not on file  ? Drug use: Not on file  ? Sexual activity: Not on file  ?Other Topics Concern  ? Not on file  ?Social History Narrative  ? Not on file  ? ?Social Determinants of Health  ? ?Financial Resource Strain: Not on file  ?Food Insecurity: Not on file  ?Transportation Needs: Not on file  ?Physical Activity: Not on file  ?Stress: Not on file  ?Social Connections: Not on file  ? ?Additional Social History: ?  ? ?Allergies:  No Known Allergies ? ?Labs:  ?Results for orders placed or performed during the hospital encounter of 03/17/22 (from the past 48 hour(s))  ?CBG monitoring, ED     Status: Abnormal  ? Collection Time: 03/17/22 10:13 PM  ?Result Value Ref Range  ? Glucose-Capillary 147 (H) 70 - 99 mg/dL  ?  Comment: Glucose reference range applies only to samples taken after fasting for at least 8 hours.  ?CBC with Differential     Status: None  ? Collection Time: 03/17/22 10:25 PM  ?Result Value Ref Range  ? WBC 6.0 4.0 - 10.5 K/uL  ? RBC 4.95 4.22 - 5.81 MIL/uL  ? Hemoglobin 14.8 13.0 -  17.0 g/dL  ? HCT 42.9 39.0 - 52.0 %  ? MCV 86.7 80.0 - 100.0 fL  ? MCH 29.9 26.0 - 34.0 pg  ? MCHC 34.5 30.0 - 36.0 g/dL  ? RDW 12.4 11.5 - 15.5 %  ? Platelets 167 150 - 400 K/uL  ? nRBC 0.0 0.0 - 0.2 %  ? Neutrophils Relative % 75 %  ? Neutro Abs 4.4 1.7 - 7.7 K/uL  ? Lymphocytes Relative 18 %  ? Lymphs Abs 1.1 0.7 - 4.0 K/uL  ? Monocytes Relative 7 %  ? Monocytes Absolute 0.4 0.1 - 1.0 K/uL  ? Eosinophils Relative 0 %  ? Eosinophils Absolute 0.0 0.0 - 0.5 K/uL  ? Basophils Relative 0 %  ? Basophils Absolute 0.0 0.0 - 0.1 K/uL  ? Immature Granulocytes 0 %  ? Abs Immature Granulocytes 0.02 0.00 - 0.07 K/uL  ?  Comment: Performed at [facility], [place] [address], [place], [place] [address]   ?Comprehensive metabolic panel     Status: Abnormal  ? Collection Time: 03/17/22 10:25 PM  ?Result Value Ref Range  ? Sodium 139 135 - 145 mmol/L  ? Potassium 3.2 (L) 3.5 - 5.1 mmol/L  ? Chloride 108 98 - 111 mmol/L  ? CO2 19 (L) 22 - 32 mmol/L  ? Glucose, Bld 126 (H) 70 - 99 mg/dL  ?  Comment: Glucose reference range applies only to samples taken after fasting for at least 8 hours.  ? BUN 12 6 - 20 mg/dL  ? Creatinine, Ser 1.42 (H) 0.61 - 1.24 mg/dL  ? Calcium 8.8 (L) 8.9 - 10.3 mg/dL  ? Total Protein 5.2 (L) 6.5 - 8.1 g/dL  ? Albumin 3.4 (L) 3.5 - 5.0 g/dL  ? AST 23 15 - 41 U/L  ? ALT 17 0 - 44 U/L  ? Alkaline Phosphatase 20 (L) 38 - 126 U/L  ? Total Bilirubin 1.1 0.3 - 1.2 mg/dL  ? GFR, Estimated >60 >60 mL/min  ?  Comment: (NOTE) ?Calculated using the CKD-EPI Creatinine Equation (2021) ?  ? Anion gap 12 5 - 15  ?  Comment: Performed at [facility], [place] [address] [place], [place] [address]  ?I-Stat venous blood gas, [facility] ED only)     Status: Abnormal  ? Collection Time: 03/17/22 10:29 PM  ?Result Value Ref Range  ? pH, Ven 7.396 7.25 - 7.43  ? pCO2, Ven 31.5 (L) 44 - 60 mmHg  ? pO2, Ven 103 (H) 32 - 45 mmHg  ? Bicarbonate 19.3 (L) 20.0 - 28.0 mmol/L  ? TCO2 20 (L) 22 - 32 mmol/L  ? O2 Saturation 98 %  ? Acid-base deficit 4.0 (H) 0.0 - 2.0 mmol/L  ? Sodium 140 135 - 145 mmol/L  ? Potassium 3.2 (L) 3.5 - 5.1 mmol/L  ? Calcium, Ion 1.13 (L) 1.15 - 1.40 mmol/L  ? HCT 45.0 39.0 - 52.0 %  ? Hemoglobin 15.3 13.0 - 17.0 g/dL  ? Sample type VENOUS   ?Magnesium     Status: Abnormal  ? Collection Time: 03/17/22 10:29 PM  ?Result Value Ref Range  ? Magnesium 1.5 (L) 1.7 - 2.4 mg/dL  ?  Comment: Performed at [facility], [place] [address] [place], [place] [address]  ?Lactic acid, plasma     Status: None  ? Collection Time: 03/17/22 10:44 PM  ?Result Value Ref Range  ? Lactic Acid, Venous 1.9 0.5 - 1.9 mmol/L  ?  Comment: Performed at  [facility], [place] [address], [place], [place] [address]  ?Acetaminophen  level     Status: Abnormal  ? Collection Time: 03/17/22 11:04 PM  ?Result Value Ref Range  ? Acetaminophen (Tylenol), Serum <10 (L) 10 - 30 ug/mL  ?  Comment: (NOTE) ?Therapeutic concentrations vary significantly. A range of 10-30 ug/mL  ?may be an effective concentration for many patients. However, some  ?are best treated at concentrations outside of this range. ?Acetaminophen concentrations >150 ug/mL at 4 hours after ingestion  ?and >50 ug/mL at 12 hours after ingestion are often associated with  ?toxic reactions. ? ?Performed at [facility], [place] [address], [place], [place] ?[address] ?  ?Salicylate level     Status: Abnormal  ? Collection Time: 03/17/22 11:04 PM  ?Result Value Ref Range  ? Salicylate Lvl [id number] (L) 7.0 - 30.0 mg/dL  ?  Comment: Performed at [facility], [place] [address], [place], [place] [address]  ?Ethanol     Status: None  ? Collection Time: 03/17/22 11:04 PM  ?Result Value Ref Range  ? Alcohol, Ethyl (B) <10 <10 mg/dL  ?  Comment: (NOTE) ?Lowest detectable limit for serum alcohol is 10 mg/dL. ? ?For medical purposes only. ?Performed at [facility], [place] [address], [place], [place] ?[address] ?  ?Rapid urine drug screen (hospital performed)     Status: Abnormal  ? Collection Time: 03/18/22 12:30 AM  ?Result Value Ref Range  ? Opiates NONE DETECTED NONE DETECTED  ? Cocaine POSITIVE (A) NONE DETECTED  ? Benzodiazepines NONE DETECTED NONE DETECTED  ? Amphetamines NONE DETECTED NONE DETECTED  ? Tetrahydrocannabinol POSITIVE (A) NONE DETECTED  ? Barbiturates NONE DETECTED NONE DETECTED  ?  Comment: (NOTE) ?DRUG SCREEN FOR MEDICAL PURPOSES ?ONLY.  IF CONFIRMATION IS NEEDED ?FOR ANY PURPOSE, NOTIFY LAB ?WITHIN 5 DAYS. ? ?LOWEST DETECTABLE LIMITS ?FOR URINE DRUG SCREEN ?Drug Class                     Cutoff (ng/mL) ?Amphetamine and metabolites    1000 ?Barbiturate and metabolites    200 ?Benzodiazepine                 200 ?Tricyclics and metabolites     300 ?Opiates and metabolites         300 ?Cocaine and metabolites        300 ?THC                            50 ?Performed at [facility], [place] [address] [place], [place] ?[address] ?  ?Urinalysis, Routine w reflex microscopic

## 2022-03-19 DIAGNOSIS — T50902S Poisoning by unspecified drugs, medicaments and biological substances, intentional self-harm, sequela: Secondary | ICD-10-CM

## 2022-03-19 DIAGNOSIS — T50902D Poisoning by unspecified drugs, medicaments and biological substances, intentional self-harm, subsequent encounter: Secondary | ICD-10-CM

## 2022-03-19 MED ORDER — NICOTINE 21 MG/24HR TD PT24
21.0000 mg | MEDICATED_PATCH | Freq: Every day | TRANSDERMAL | Status: DC
Start: 2022-03-19 — End: 2022-03-20
  Administered 2022-03-19 – 2022-03-20 (×2): 21 mg via TRANSDERMAL
  Filled 2022-03-19 (×2): qty 1

## 2022-03-19 NOTE — Consult Note (Signed)
[facility] Psychiatry New Face-to-Face Psychiatric Evaluation ? ? ?Service Date: March 19, 2022 ?LOS:  LOS: 1 day  ? ? ?Assessment  ?Khalik Willert is a 31 y.o. male admitted medically for 03/17/2022 10:05 PM for OD on quetiapine with intent to end life . He carries unknown psychiatric diagnoses and has a past medical history of multiple substance use disorders. Psychiatry was consulted for intentional drug overdose by Dr. Loney Loh.  ? ? ?Patient appeared to have some psychotic symptoms on exam, most notably paranoia.  Additionally noted to be responding to internal stimuli by overnight nursing staff Endorsed hallucinations leading into the suicide attempt, although did deny command auditory hallucinations telling him to overdose.  Likely has extensive psych history based on history of multiple hospitalizations for "acting the fool" although exact diagnosis unknown to patient.  Most likely meets criteria for schizophrenia vs schizoaffective disorder vs substance-induced psychotic disorder.  Based on severity of symptoms and initial overdose, will require inpatient psychiatric hospitalization.  He is now medically clear.  We will continue current olanzapine dosage as this is a new medication for him although will likely increase if not accepted to inpatient psychiatry within the next couple of days. ? ?Please see plan below for detailed recommendations.  ? ?Diagnoses:  ?Active Hospital problems: ?Principal Problem: ?  Intentional drug overdose ([facility]) ?Active Problems: ?  Polysubstance abuse ([facility]) ?  Hypokalemia ?  Hypomagnesemia ?  Metabolic acidosis ?  Drug overdose, intentional ([facility]) ?  ? ? ?Plan  ?## Safety and Observation Level:  ?- Based on my clinical evaluation, I estimate the patient to be at high risk of self harm in the current setting ?- At this time, we recommend a 1:1 level of observation. This decision is based on my review of the chart including patient's history and current presentation,  interview of the patient, mental status examination, and consideration of suicide risk including evaluating suicidal ideation, plan, intent, suicidal or self-harm behaviors, risk factors, and protective factors. This judgment is based on our ability to directly address suicide risk, implement suicide prevention strategies and develop a safety plan while the patient is in the clinical setting. Please contact our team if there is a concern that risk level has changed. ? ? ?## Medications:  ?-- c olanzapine 5 mg BID ? ?## Medical Decision Making Capacity:  ?Not formally assessed ? ?## Further Work-up:  ?-- TSH, B12 ? ? ? ?-- most recent EKG on 4/17 had QtC of 452 ?-- Pertinent labwork reviewed earlier this admission includes: UDS + cocaine THC, ethanol <10 ? ?## Disposition:  ?-- Inpt psych ? ? ?Thank you for this consult request. Recommendations have been communicated to the primary team.  We will CO at this time.  ? ?Claris Che A Ledger Heindl ? ? ?New history   ?Relevant Aspects of Hospital Course:  ?Admitted on 03/17/2022 for OD on quetiapine. ? ?Patient Report:  ?Patient seen in late afternoon.  Discussed circumstances surrounding suicide attempt-describes significant ambivalence stating I wanted to take them and I did not want to take them at the same time" and expresses regret that the suicide attempt did not result in his death.   References multiple prior psychiatric hospitalizations including several in [place] and at least one at [facility] for "acting a fool".  Not sure what his diagnosis is.  Has been on Seroquel since "age 31".  Endorses hallucinations recently but not currently. Significant paranoia noted on exam-is this author to close door and makes several  references to "people over here in our conversation"; becomes closed off and reserved when door is left cracked.  ? ?ROS:  ?See HPI ? ?Collateral information:  ?None obtained ? ?Psychiatric History:  ?Information collected from pt, medical  record ?Likely psychotic spectrum - schizoaffective vs schizophrenia vs substance induced mood ? ?Family psych history: Unknown ? ? ?Social History:  ?Unknown ?Family History:  ?Unknown ?The patient's family history is not on file. ? ?Medical History: ?No past medical history on file. ? ?Medications:  ? ?Current Facility-Administered Medications:  ?  acetaminophen (TYLENOL) tablet 650 mg, 650 mg, Oral, Q6H PRN, Lonia Blood, MD ?  alum & mag hydroxide-simeth (MAALOX/MYLANTA) 200-200-20 MG/5ML suspension 30 mL, 30 mL, Oral, Q6H PRN, Lonia Blood, MD ?  enoxaparin (LOVENOX) injection 40 mg, 40 mg, Subcutaneous, Q24H, John Giovanni, MD, 40 mg at 03/19/22 1012 ?  LORazepam (ATIVAN) injection 1-2 mg, 1-2 mg, Intravenous, Q4H PRN, Lonia Blood, MD, 1 mg at 03/18/22 1132 ?  OLANZapine zydis (ZYPREXA) disintegrating tablet 5 mg, 5 mg, Oral, BID, Gerhard Munch, MD, 5 mg at 03/19/22 1012 ?  ondansetron (ZOFRAN) injection 4 mg, 4 mg, Intravenous, Q6H PRN, Lonia Blood, MD ? ?Allergies: ?No Known Allergies ? ? ?  ?Objective  ?Vital signs:  ?Temp:  [97.5 ?F (36.4 ?C)-98.7 ?F (37.1 ?C)] 98.7 ?F (37.1 ?C) (04/17 1601) ?Pulse Rate:  [60-81] 65 (04/17 1601) ?Resp:  [15-20] 18 (04/17 1601) ?BP: (120-165)/(80-108) 122/82 (04/17 1601) ?SpO2:  [97 %-99 %] 99 % (04/17 1601) ? ?Psychiatric Specialty Exam: ? ?Presentation  ?General Appearance: Disheveled ? ?Eye Contact:Fair ? ?Speech:-- (difficult to understand when speaks more quickly) ? ?Speech Volume:Normal (rate increased) ? ?Handedness:No data recorded ? ?Mood and Affect  ?Mood:-- (Angry, frustrated) ? ?Affect:Congruent ? ? ?Thought Process  ?Thought Processes:Disorganized ? ?Descriptions of Associations:Loose ? ?Orientation:Full (Time, Place and Person) ? ?Thought Content:Paranoid Ideation ? ?History of Schizophrenia/Schizoaffective disorder:-- (Unknown) ? ?Duration of Psychotic Symptoms:-- (Unknown) ? ?Hallucinations:Hallucinations: Auditory ? ?Ideas  of Reference:Other (comment) (UTA) ? ?Suicidal Thoughts:Suicidal Thoughts: No ? ?Homicidal Thoughts:Homicidal Thoughts: No ? ? ?Sensorium  ?Memory:Immediate Fair; Recent Fair; Remote Good ? ?Judgment:Poor ?Insight:Lacking ? ? ?Executive Functions  ?Concentration:Poor ? ?Attention Span:Poor ? ?Recall:Poor ? ?Fund of Knowledge:Poor ? ?Language:Fair ? ? ?Psychomotor Activity  ?Psychomotor Activity:Psychomotor Activity: -- (gestures frequently) ? ? ?Assets  ?Assets:Physical Health ? ?Sleep  ?Sleep:Sleep: -- (Unknown) ? ? ?Physical Exam: ?Physical Exam ?HENT:  ?   Head: Normocephalic.  ?Pulmonary:  ?   Effort: Pulmonary effort is normal.  ?Neurological:  ?   Mental Status: He is alert and oriented to person, place, and time.  ? ?Review of Systems  ?Reason unable to perform ROS: psychiatric condition.  ?Psychiatric/Behavioral:    ?     Paranoia ?  ?Blood pressure 122/82, pulse 65, temperature 98.7 ?F (37.1 ?C), temperature source Oral, resp. rate 18, SpO2 99 %. There is no height or weight on file to calculate BMI. ? ?

## 2022-03-19 NOTE — Progress Notes (Signed)
? ?  As stated in my Progress Note dated 03/18/22, this patient is medically cleared for discharge. Final disposition is at discretion of Psychiatry. Pt to remain under IVC until placed in inpatient psychiatric facility, or otherwise dispositioned by Psychiatry.  ? ?Lonia Blood, MD ?[facility] ?Office  [phone] ? ?03/19/2022, 9:02 AM ? ?

## 2022-03-19 NOTE — Progress Notes (Signed)
? Rickey Miller  [id number] DOB: 10/07/1991 DOA: 03/17/2022 ?PCP: Pcp, No   ? ?Brief Narrative:  ?31yo with a history of mood disorder who presented to the ED via EMS from a hotel after he called EMS reporting having taken twelve 200 mg quetiapine as a suicidal gesture.  EMS found him hypotensive and tachycardic.  This persisted upon arrival in the ED.  EKG revealed sinus tachycardia with prolonged QT.  He was volume resuscitated with 3 L and provided empiric magnesium.  Blood pressure remained low and he required low-dose Levophed transiently.  With subsequent volume expansion blood pressure did improve and Levophed was able to be stopped.  The patient was noted to be quite agitated at times and required Ativan. ? ?Consultants:  ?Psychiatry ? ?Code Status: FULL CODE ? ?DVT prophylaxis: ?Lovenox ? ?Interim Hx: ?No physical complaints.  Has been observed having conversations with himself as well as others who are not in the room.  Appears to be responding to external stimuli.  Medically cleared from his toxic ingestion. ? ?Assessment & Plan: ? ?Intentional toxic quetiapine ingestion ?hypotension resolved -QT interval has improved on follow-up EKG -now medically cleared from this diagnosis ? ?Suicidal gesture ?Psychiatry directing care -will need inpatient psychiatric care ? ?Hypokalemia ?Corrected with supplementation ? ?Hypomagnesemia ?Has been supplemented ? ?Family Communication: No family present at time of exam ?Disposition: Medically clear for discharge -under IVC -awaiting psychiatric disposition ? ?Objective: ?Blood pressure 123/81, pulse 60, temperature 98 ?F (36.7 ?C), temperature source Oral, resp. rate 20, SpO2 98 %. ? ?Intake/Output Summary (Last 24 hours) at 03/19/2022 1012 ?Last data filed at 03/19/2022 0830 ?Gross per 24 hour  ?Intake 463.47 ml  ?Output 550 ml  ?Net -86.53 ml  ? ?There were no vitals filed for this visit. ? ?Examination: ?General: No acute respiratory distress ?Lungs: Clear to  auscultation bilaterally without wheezes or crackles ?Cardiovascular: Regular rate and rhythm without murmur gallop or rub normal S1 and S2 ?Abdomen: Nontender, nondistended, soft, bowel sounds positive, no rebound, no ascites, no appreciable mass ?Extremities: No significant cyanosis, clubbing, or edema bilateral lower extremities ? ? ?CBC: ?Recent Labs  ?Lab 03/17/22 ?2225 03/17/22 ?2229  ?WBC 6.0  --   ?NEUTROABS 4.4  --   ?HGB 14.8 15.3  ?HCT 42.9 45.0  ?MCV 86.7  --   ?PLT 167  --   ? ? ?Basic Metabolic Panel: ?Recent Labs  ?Lab 03/17/22 ?2225 03/17/22 ?2229 03/18/22 ?2330 03/18/22 ?1116  ?NA 139 140 140 139  ?K 3.2* 3.2* 3.8 3.8  ?CL 108  --  114* 113*  ?CO2 19*  --  20* 20*  ?GLUCOSE 126*  --  90 79  ?BUN 12  --  8 7  ?CREATININE 1.42*  --  1.05 1.04  ?CALCIUM 8.8*  --  8.2* 8.3*  ?MG  --  1.5*  --  1.9  ? ? ?GFR: ?CrCl cannot be calculated (Unknown ideal weight.). ? ?Liver Function Tests: ?Recent Labs  ?Lab 03/17/22 ?2225 03/18/22 ?1116  ?AST 23 20  ?ALT 17 16  ?ALKPHOS 20* 18*  ?BILITOT 1.1 1.3*  ?PROT 5.2* 4.8*  ?ALBUMIN 3.4* 3.2*  ? ? ?CBG: ?Recent Labs  ?Lab 03/17/22 ?2213  ?GLUCAP 147*  ? ? ? ?Scheduled Meds: ? enoxaparin (LOVENOX) injection  40 mg Subcutaneous Q24H  ? OLANZapine zydis  5 mg Oral BID  ? ? ? LOS: 1 day  ? ?Lonia Blood, MD ?[facility] ?Office  [phone] ?Pager - Text Page per Loretha Stapler ? ?  If 7PM-7AM, please contact night-coverage per Amion ?03/19/2022, 10:12 AM ? ? ? ? ?

## 2022-03-19 NOTE — Plan of Care (Signed)
Pt alert and oriented x 3. Disoriented to time. Pt cooperative with most care. At times has increasing agitation and frustration with sitter. Pt cooperative with medication, Showed some resistance with care with EKG but was able to obtain. Pt at times seems to be hallucinating. Speech is soft at times hard to understand what pt is saying is accurate. Pt was talking about his IV and asked CNA if she saw bees under the dsg. Flight of ideas noted. ?Problem: Coping: ?Goal: Level of anxiety will decrease ?Outcome: Not Progressing ?  ?Problem: Education: ?Goal: Knowledge of General Education information will improve ?Description: Including pain rating scale, medication(s)/side effects and non-pharmacologic comfort measures ?Outcome: Progressing ?  ?Problem: Health Behavior/Discharge Planning: ?Goal: Ability to manage health-related needs will improve ?Outcome: Progressing ?  ?Problem: Clinical Measurements: ?Goal: Ability to maintain clinical measurements within normal limits will improve ?Outcome: Progressing ?Goal: Will remain free from infection ?Outcome: Progressing ?Goal: Diagnostic test results will improve ?Outcome: Progressing ?Goal: Respiratory complications will improve ?Outcome: Progressing ?Goal: Cardiovascular complication will be avoided ?Outcome: Progressing ?  ?Problem: Activity: ?Goal: Risk for activity intolerance will decrease ?Outcome: Progressing ?  ?Problem: Nutrition: ?Goal: Adequate nutrition will be maintained ?Outcome: Progressing ?  ?Problem: Elimination: ?Goal: Will not experience complications related to bowel motility ?Outcome: Progressing ?Goal: Will not experience complications related to urinary retention ?Outcome: Progressing ?  ?Problem: Pain Managment: ?Goal: General experience of comfort will improve ?Outcome: Progressing ?  ?Problem: Safety: ?Goal: Ability to remain free from injury will improve ?Outcome: Progressing ?  ?Problem: Skin Integrity: ?Goal: Risk for impaired skin integrity  will decrease ?Outcome: Progressing ?  ?

## 2022-03-19 NOTE — TOC Progression Note (Addendum)
Transition of Care (TOC) - Progression Note  ? ? ?Patient Details  ?Name: Rickey Miller ?MRN: [id number] ?Date of Birth: [birth year] ? ?Transition of Care (TOC) CM/SW Contact  ?Mearl Latin, LCSW ?Phone Number: ?03/19/2022, 9:36 AM ? ?Clinical Narrative:    ?9:36am-CSW sent referral inpatient psych referral to [facility], [facility], [facility], and [facility]. Lack of insurance may be a barrier.  ? ?10:15am-[facility] does not have beds available at this time. [facility] does not have beds at this time.  ? ? ?Expected Discharge Plan: Psychiatric Hospital ?Barriers to Discharge: Psych Bed not available, Inadequate or no insurance ? ?Expected Discharge Plan and Services ?Expected Discharge Plan: Psychiatric Hospital ?In-house Referral: Clinical Social Work ?  ?  ?  ?                ?  ?  ?  ?  ?  ?  ?  ?  ?  ?  ? ? ?Social Determinants of Health (SDOH) Interventions ?  ? ?Readmission Risk Interventions ?   ? View : No data to display.  ?  ?  ?  ? ? ?

## 2022-03-20 ENCOUNTER — Inpatient Hospital Stay (HOSPITAL_COMMUNITY)
Admission: AD | Admit: 2022-03-20 | Discharge: 2022-03-26 | DRG: 885 | Disposition: A | Discharge status: Home / Self Care | Payer: Federal, State, Local not specified - Other | Source: Intra-hospital | Attending: Psychiatry | Admitting: Psychiatry

## 2022-03-20 ENCOUNTER — Encounter (HOSPITAL_COMMUNITY): Payer: Self-pay | Admitting: Psychiatry

## 2022-03-20 ENCOUNTER — Other Ambulatory Visit: Payer: Self-pay

## 2022-03-20 DIAGNOSIS — T43592D Poisoning by other antipsychotics and neuroleptics, intentional self-harm, subsequent encounter: Secondary | ICD-10-CM | POA: Diagnosis not present

## 2022-03-20 DIAGNOSIS — F1090 Alcohol use, unspecified, uncomplicated: Secondary | ICD-10-CM | POA: Diagnosis present

## 2022-03-20 DIAGNOSIS — F4010 Social phobia, unspecified: Secondary | ICD-10-CM | POA: Diagnosis present

## 2022-03-20 DIAGNOSIS — R4182 Altered mental status, unspecified: Secondary | ICD-10-CM

## 2022-03-20 DIAGNOSIS — F411 Generalized anxiety disorder: Secondary | ICD-10-CM | POA: Diagnosis present

## 2022-03-20 DIAGNOSIS — F1210 Cannabis abuse, uncomplicated: Secondary | ICD-10-CM | POA: Diagnosis present

## 2022-03-20 DIAGNOSIS — F410 Panic disorder [episodic paroxysmal anxiety] without agoraphobia: Secondary | ICD-10-CM | POA: Diagnosis present

## 2022-03-20 DIAGNOSIS — F209 Schizophrenia, unspecified: Secondary | ICD-10-CM | POA: Diagnosis not present

## 2022-03-20 DIAGNOSIS — F17200 Nicotine dependence, unspecified, uncomplicated: Secondary | ICD-10-CM | POA: Diagnosis present

## 2022-03-20 DIAGNOSIS — F259 Schizoaffective disorder, unspecified: Secondary | ICD-10-CM | POA: Diagnosis present

## 2022-03-20 DIAGNOSIS — F1410 Cocaine abuse, uncomplicated: Secondary | ICD-10-CM | POA: Diagnosis present

## 2022-03-20 DIAGNOSIS — F1910 Other psychoactive substance abuse, uncomplicated: Secondary | ICD-10-CM | POA: Diagnosis present

## 2022-03-20 DIAGNOSIS — Z9151 Personal history of suicidal behavior: Secondary | ICD-10-CM | POA: Diagnosis not present

## 2022-03-20 DIAGNOSIS — F29 Unspecified psychosis not due to a substance or known physiological condition: Secondary | ICD-10-CM | POA: Diagnosis present

## 2022-03-20 DIAGNOSIS — E876 Hypokalemia: Secondary | ICD-10-CM

## 2022-03-20 DIAGNOSIS — G4700 Insomnia, unspecified: Secondary | ICD-10-CM | POA: Diagnosis present

## 2022-03-20 DIAGNOSIS — F250 Schizoaffective disorder, bipolar type: Secondary | ICD-10-CM | POA: Diagnosis present

## 2022-03-20 LAB — RESP PANEL BY RT-PCR (FLU A&B, COVID) ARPGX2
Influenza A by PCR: NEGATIVE
Influenza B by PCR: NEGATIVE
SARS Coronavirus 2 by RT PCR: NEGATIVE

## 2022-03-20 MED ORDER — DIPHENHYDRAMINE HCL 25 MG PO CAPS: 50.0000 mg | ORAL_CAPSULE | Freq: Four times a day (QID) | ORAL | Status: DC | PRN

## 2022-03-20 MED ORDER — TRAZODONE HCL 50 MG PO TABS
50.0000 mg | ORAL_TABLET | Freq: Every evening | ORAL | Status: DC | PRN
Start: 2022-03-20 — End: 2022-03-26
  Administered 2022-03-20 – 2022-03-25 (×6): 50 mg via ORAL
  Filled 2022-03-20 (×2): qty 1
  Filled 2022-03-20: qty 7
  Filled 2022-03-20 (×4): qty 1

## 2022-03-20 MED ORDER — ALUM & MAG HYDROXIDE-SIMETH 200-200-20 MG/5ML PO SUSP: 30.0000 mL | Freq: Four times a day (QID) | ORAL | Status: DC | PRN

## 2022-03-20 MED ORDER — ACETAMINOPHEN 325 MG PO TABS
650.0000 mg | ORAL_TABLET | Freq: Four times a day (QID) | ORAL | Status: DC | PRN
Start: 2022-03-20 — End: 2022-03-26

## 2022-03-20 MED ORDER — OLANZAPINE 5 MG PO TBDP
5.0000 mg | ORAL_TABLET | Freq: Two times a day (BID) | ORAL | Status: DC
Start: 2022-03-20 — End: 2022-03-26

## 2022-03-20 MED ORDER — LORAZEPAM 2 MG/ML IJ SOLN: 1.0000 mg | INTRAMUSCULAR | Status: DC | PRN

## 2022-03-20 MED ORDER — ZIPRASIDONE MESYLATE 20 MG IM SOLR: 20.0000 mg | Freq: Two times a day (BID) | INTRAMUSCULAR | Status: DC | PRN

## 2022-03-20 MED ORDER — OLANZAPINE 5 MG PO TBDP
5.0000 mg | ORAL_TABLET | Freq: Two times a day (BID) | ORAL | Status: DC
  Administered 2022-03-20 – 2022-03-21 (×3): 5 mg via ORAL
  Filled 2022-03-20 (×8): qty 1

## 2022-03-20 MED ORDER — HYDROXYZINE HCL 25 MG PO TABS
25.0000 mg | ORAL_TABLET | Freq: Three times a day (TID) | ORAL | Status: DC | PRN
  Administered 2022-03-20 – 2022-03-25 (×6): 25 mg via ORAL
  Filled 2022-03-20 (×4): qty 1
  Filled 2022-03-20: qty 10
  Filled 2022-03-20 (×2): qty 1

## 2022-03-20 MED ORDER — MAGNESIUM HYDROXIDE 400 MG/5ML PO SUSP: 30.0000 mL | Freq: Every day | ORAL | Status: DC | PRN

## 2022-03-20 MED ORDER — ACETAMINOPHEN 325 MG PO TABS
650.0000 mg | ORAL_TABLET | Freq: Four times a day (QID) | ORAL | Status: DC | PRN
Start: 2022-03-20 — End: 2022-03-26
  Administered 2022-03-20: 650 mg via ORAL
  Filled 2022-03-20 (×2): qty 2

## 2022-03-20 MED ORDER — LORAZEPAM 1 MG PO TABS
1.0000 mg | ORAL_TABLET | ORAL | Status: DC | PRN
Start: 2022-03-20 — End: 2022-03-26

## 2022-03-20 MED ORDER — NICOTINE 21 MG/24HR TD PT24
21.0000 mg | MEDICATED_PATCH | Freq: Every day | TRANSDERMAL | Status: DC
  Administered 2022-03-21 – 2022-03-25 (×5): 21 mg via TRANSDERMAL
  Filled 2022-03-20 (×8): qty 1

## 2022-03-20 NOTE — Discharge Summary (Signed)
?DISCHARGE SUMMARY ? ?Jones Duhn ? ?MR#: [id number] ? ?DOB:[birth year]  ?Date of Admission: 03/17/2022 ?Date of Discharge: 03/20/2022 ? ?Attending Physician:Demitris Pokorny Hennie Duos, MD ? ?Patient's PCP:Pcp, No ? ?Consults: Psychiatry  ? ?Disposition: D/C to facility directed by Psychiatry  ? ?Follow-up Appts: ? Follow-up Information   ? ? [place]. Call.   ?Why: [place] primary care sites that work with you personally and financially. ?Uninsured? Have insurance but having a hard time affording co-pay? Look to [employer] for assistance. Our team will assist you in helping get a primary care doctor. ? ?Several sites to serve you ?[facility]: They have primary care, financial counselling and pharmacy.  ? [place] ra[facility] [phone]. ?Primary Care at [facility] ?[place] ?Contact information: ?[place] ?[place] [phone] ?[phone] ? ?  ?  ? ?  ?  ? ?  ? ? ?Discharge Diagnoses: ?Intentional toxic quetiapine ingestion ?Suicidal gesture ?Hypokalemia ?Hypomagnesemia ? ?Initial presentation: ?31yo with a history of mood disorder who presented to the ED via EMS from a hotel after he called EMS reporting having taken twelve 200 mg quetiapine as a suicidal gesture.  EMS found him hypotensive and tachycardic.  This persisted upon arrival in the ED.  EKG revealed sinus tachycardia with prolonged QT.  He was volume resuscitated with 3 L and provided empiric magnesium.  Blood pressure remained low and he required low-dose Levophed transiently.  With subsequent volume expansion blood pressure did improve and Levophed was able to be stopped.  The patient was noted to be quite agitated at times and required Ativan. ? ?Hospital Course: ? ?Intentional toxic quetiapine ingestion ?hypotension initially encountered at time of admission resolved after short course of  Levophed and volume expansion -blood pressure has remained stable throughout remainder of hospital stay without support  - QT interval, which was initially prolonged in the setting of hypokalemia, hypomagnesemia, and overdose, has improved on follow-up EKG - now medically cleared for discharge from the acute hospital ?  ?Suicidal gesture ?Psychiatry directed care -will need inpatient psychiatric care which has been arranged by the psychiatry team ?  ?Hypokalemia ?Corrected with supplementation with follow-up potassium normal ?  ?Hypomagnesemia ?Has been supplemented with follow-up magnesium normal ? ?Allergies as of 03/20/2022   ?No Known Allergies ?  ? ?  ?Medication List  ?  ? ?STOP taking these medications   ? ?prazosin 1 MG capsule ?Commonly known as: MINIPRESS ?  ?QUEtiapine 200 MG tablet ?Commonly known as: SEROQUEL ?  ? ?  ? ?TAKE these medications   ? ?acetaminophen 325 MG tablet ?Commonly known as: TYLENOL ?Take 2 tablets (650 mg total) by mouth every 6 (six) hours as needed for mild pain, fever or headache. ?  ?OLANZapine zydis 5 MG disintegrating tablet ?Commonly known as: ZYPREXA ?Take 1 tablet (5 mg total) by mouth 2 (two) times daily. ?  ? ?  ? ? ?Day of Discharge ?BP (!) 153/99 (BP Location: Right Arm)   Pulse 60   Temp 98 ?F (36.7 ?C) (Oral)   Resp 18   SpO2 97%  ? ?Physical Exam: ?General: No acute respiratory distress ?Lungs: Clear to auscultation bilaterally without wheezes or crackles ?Cardiovascular: Regular rate and rhythm without murmur gallop or rub normal S1 and S2 ?Abdomen: Nontender, nondistended, soft, bowel sounds positive, no rebound, no ascites, no appreciable mass ?Extremities: No significant cyanosis, clubbing, or edema bilateral lower extremities ? ?Basic  Metabolic Panel: ?Recent Labs  ?Lab 03/17/22 ?2225 03/17/22 ?2229 03/18/22 ?[id number] 03/18/22 ?1116  ?NA 139 140 140 139  ?K 3.2* 3.2* 3.8 3.8  ?CL 108  --  114* 113*  ?CO2 19*  --  20* 20*  ?GLUCOSE 126*  --  90 79  ?BUN 12  --  8  7  ?CREATININE 1.42*  --  1.05 1.04  ?CALCIUM 8.8*  --  8.2* 8.3*  ?MG  --  1.5*  --  1.9  ? ? ?Liver Function Tests: ?Recent Labs  ?Lab 03/17/22 ?2225 03/18/22 ?1116  ?AST 23 20  ?ALT 17 16  ?ALKPHOS 20* 18*  ?BILITOT 1.1 1.3*  ?PROT 5.2* 4.8*  ?ALBUMIN 3.4* 3.2*  ? ? ?CBC: ?Recent Labs  ?Lab 03/17/22 ?2225 03/17/22 ?2229  ?WBC 6.0  --   ?NEUTROABS 4.4  --   ?HGB 14.8 15.3  ?HCT 42.9 45.0  ?MCV 86.7  --   ?PLT 167  --   ? ? ? ?Time spent in discharge (includes decision making & examination of pt): ?30 minutes ? ?03/20/2022, 10:01 AM  ? ?Cherene Altes, MD ?[facility] ?Office  [phone] ? ? ? ? ? ?

## 2022-03-20 NOTE — TOC Transition Note (Addendum)
Transition of Care (TOC) - CM/SW Discharge Note ? ? ?Patient Details  ?Name: Rickey Miller ?MRN: [id number] ?Date of Birth: 08/14/1991 ? ?Transition of Care (TOC) CM/SW Contact:  ?Mearl Latin, LCSW ?Phone Number: ?03/20/2022, 1:01 PM ? ? ?Clinical Narrative:    ?Patient will DC to: [facility]  ?Anticipated DC date: 03/20/22 ?Family notified: attempted to call Doy Mince at request of patient but no answer and no voicemail.  ?Transport by: [employer] (patient IVC'd) ? ? ?Per MD patient ready for DC to [facility]. COVID resulted negative. IVC faxed to [facility]. RN to call report prior to discharge [phone]). RN, patient, and facility notified of DC.  ? ?CSW will sign off for now as social work intervention is no longer needed. Please consult [place] again if new needs arise. ? ? ? ? ?Final next level of care: Psychiatric Hospital ?Barriers to Discharge: Barriers Resolved ? ? ?Patient Goals and CMS Choice ?  ?  ?  ? ?Discharge Placement ?  ?           ?  ?Patient to be transferred to facility by: [facility] due to IVC ?Name of family member notified: attempted to call Doy Mince at pt's request ?Patient and family notified of of transfer: 03/20/22 ? ?Discharge Plan and Services ?In-house Referral: Clinical Social Work ?  ?           ?  ?  ?  ?  ?  ?  ?  ?  ?  ?  ? ?Social Determinants of Health (SDOH) Interventions ?  ? ? ?Readmission Risk Interventions ?   ? View : No data to display.  ?  ?  ?  ? ? ? ? ? ?
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
## 2022-03-20 NOTE — TOC Progression Note (Signed)
Transition of Care (TOC) - Progression Note  ? ? ?Patient Details  ?Name: Javis Abboud ?MRN: [id number] ?Date of Birth: 08-Jul-1991 ? ?Transition of Care (TOC) CM/SW Contact  ?Mearl Latin, LCSW ?Phone Number: ?03/20/2022, 8:41 AM ? ?Clinical Narrative:    ?CSW requested [facility] check bed status again today. No offers from [facility] or [facility]. Additional referrals faxed to: ? ?Destination ?[facility]  N/A [address], [place] [address] [phone]  ?[facility]  N/A [address] [place] [address] [phone]  ?[facility]  N/A [address] [place] [address] [phone]  ?[facility]  N/A [address], [place] [address] [phone]  ?[facility]  N/A [address] [place] [address] [phone]  ?[facility]  N/A [address] [place] [address] [phone]  ?[facility]  N/A [phone] [place] [address] [phone]  ?[facility]  N/A [address] [place], [place] [address] [phone]  ?[facility]  N/A [address] [place] [address] [phone]  ?[facility]  N/A [address], [place] [address] [phone]  ?[facility]  N/A [address], [place] [address] [phone]  ?[facility]  N/A [address], [place] [address] [phone]  ?[facility]  N/A [address] [place] [address] [phone]  ? ? ? ? ?Expected Discharge Plan: Psychiatric Hospital ?Barriers to Discharge: Psych Bed not available, Inadequate or no insurance ? ?Expected Discharge Plan and Services ?Expected Discharge Plan: Psychiatric  Hospital ?In-house Referral: Clinical Social Work ?  ?  ?  ?                ?  ?  ?  ?  ?  ?  ?  ?  ?  ?  ? ? ?Social Determinants of Health (SDOH) Interventions ?  ? ?Readmission Risk Interventions ?   ? View : No data to display.  ?  ?  ?  ? ? ?

## 2022-03-20 NOTE — [facility] Group Notes (Signed)
Adult Psychoeducational Group Note ? ?Date:  03/20/2022 ?Time:  8:45 PM ? ?Group Topic/Focus:  ?Wrap-Up Group:   The focus of this group is to help patients review their daily goal of treatment and discuss progress on daily workbooks. ? ?Participation Level:  Active ? ?Participation Quality:  Attentive ? ?Affect:  Appropriate ? ?Cognitive:  Alert ? ?Insight: Appropriate ? ?Engagement in Group:  Engaged ? ?Modes of Intervention:  Discussion ? ?Additional Comments:  Patient attended and participated in the Wrap-Up group. ? ?Rickey Miller ?03/20/2022, 8:45 PM ?

## 2022-03-20 NOTE — TOC Progression Note (Signed)
Transition of Care (TOC) - Progression Note  ? ? ?Patient Details  ?Name: Rickey Miller ?MRN: [id number] ?Date of Birth: 12/05/1990 ? ?Transition of Care (TOC) CM/SW Contact  ?Benard Halsted, LCSW ?Phone Number: ?03/20/2022, 12:05 PM ? ?Clinical Narrative:    ?Pt has been accepted to [facility] today. Room 500-01, attending Dr. Berdine Addison. Report to be called to [phone]. ? ?CSW faxed IVC to [facility] (f. [phone]). Will arrange [facility] transport once room ready and COVID test results.  ? ?CSW met with patient and he is aware of plan. He requested CSW contact [name] [phone]) however no voicemail available at that number.  ? ? ? ? ?Expected Discharge Plan: [facility] ?Barriers to Discharge: Barriers Resolved ? ?Expected Discharge Plan and Services ?Expected Discharge Plan: [facility] ?In-house Referral: Clinical Social Work ?  ?  ?  ?                ?  ?  ?  ?  ?  ?  ?  ?  ?  ?  ? ? ?Social Determinants of Health (SDOH) Interventions ?  ? ?Readmission Risk Interventions ?   ? View : No data to display.  ?  ?  ?  ? ? ?

## 2022-03-20 NOTE — Progress Notes (Signed)
?   03/20/22 2100  ?Psych Admission Type (Psych Patients Only)  ?Admission Status Voluntary  ?Psychosocial Assessment  ?Patient Complaints Anxiety;Worrying  ?Eye Contact Fair  ?Facial Expression Anxious  ?Affect Anxious  ?Speech Slow  ?Interaction Cautious  ?Motor Activity Slow  ?Appearance/Hygiene Disheveled  ?Behavior Characteristics Cooperative  ?Mood Anxious;Suspicious  ?Thought Process  ?Coherency Blocking;Circumstantial  ?Content WDL  ?Delusions Paranoid  ?Perception WDL  ?Hallucination None reported or observed  ?Judgment Impaired  ?Confusion WDL  ?Danger to Self  ?Current suicidal ideation? Denies  ?Danger to Others  ?Danger to Others None reported or observed  ? ? ?

## 2022-03-21 ENCOUNTER — Encounter (HOSPITAL_COMMUNITY): Payer: Self-pay

## 2022-03-21 DIAGNOSIS — F4010 Social phobia, unspecified: Secondary | ICD-10-CM | POA: Diagnosis present

## 2022-03-21 DIAGNOSIS — F17200 Nicotine dependence, unspecified, uncomplicated: Secondary | ICD-10-CM | POA: Diagnosis present

## 2022-03-21 DIAGNOSIS — F250 Schizoaffective disorder, bipolar type: Secondary | ICD-10-CM | POA: Diagnosis present

## 2022-03-21 DIAGNOSIS — F1410 Cocaine abuse, uncomplicated: Secondary | ICD-10-CM | POA: Diagnosis present

## 2022-03-21 DIAGNOSIS — F259 Schizoaffective disorder, unspecified: Secondary | ICD-10-CM | POA: Diagnosis present

## 2022-03-21 DIAGNOSIS — F411 Generalized anxiety disorder: Secondary | ICD-10-CM | POA: Diagnosis present

## 2022-03-21 DIAGNOSIS — F1210 Cannabis abuse, uncomplicated: Secondary | ICD-10-CM | POA: Diagnosis present

## 2022-03-21 DIAGNOSIS — F1090 Alcohol use, unspecified, uncomplicated: Secondary | ICD-10-CM | POA: Diagnosis present

## 2022-03-21 LAB — LIPID PANEL
Cholesterol: 184 mg/dL (ref 0–200)
HDL: 55 mg/dL (ref >40)
LDL Cholesterol: 116 mg/dL — ABNORMAL HIGH (ref 0–99)
Total CHOL/HDL Ratio: 3.3 RATIO
Triglycerides: 66 mg/dL (ref <150)
VLDL: 13 mg/dL (ref 0–40)

## 2022-03-21 LAB — HEMOGLOBIN A1C
Hgb A1c MFr Bld: 4.8 % (ref 4.8–5.6)
Mean Plasma Glucose: 91.06 mg/dL

## 2022-03-21 LAB — TSH: TSH: 1.001 u[IU]/mL (ref 0.350–4.500)

## 2022-03-21 MED ORDER — OLANZAPINE 10 MG PO TBDP
10.0000 mg | ORAL_TABLET | Freq: Every day | ORAL | Status: DC
  Administered 2022-03-21 – 2022-03-25 (×5): 10 mg via ORAL
  Filled 2022-03-21 (×8): qty 1

## 2022-03-21 MED ORDER — OLANZAPINE 5 MG PO TBDP
5.0000 mg | ORAL_TABLET | Freq: Every day | ORAL | Status: DC
  Administered 2022-03-22 – 2022-03-26 (×5): 5 mg via ORAL
  Filled 2022-03-21 (×7): qty 1

## 2022-03-21 MED ORDER — THIAMINE HCL 100 MG PO TABS
100.0000 mg | ORAL_TABLET | Freq: Every day | ORAL | Status: DC
  Administered 2022-03-22 – 2022-03-26 (×5): 100 mg via ORAL
  Filled 2022-03-21 (×7): qty 1

## 2022-03-21 NOTE — Progress Notes (Signed)
Adult Psychoeducational Group Note ? ?Date:  03/21/2022 ?Time:  8:47 PM ? ?Group Topic/Focus:  ?Wrap-Up Group:   The focus of this group is to help patients review their daily goal of treatment and discuss progress on daily workbooks. ? ?Participation Level:  Active ? ?Participation Quality:  Appropriate ? ?Affect:  Appropriate ? ?Cognitive:  Appropriate ? ?Insight: Improving ? ?Engagement in Group:  Engaged ? ?Modes of Intervention:  Education and Exploration ? ?Additional Comments:  Patient attended and participated in group tonight. He reports that the thing he learnt today which he could take with him when he leave is being detailed about what he wants is best when talking to people. ? ?Scot Dock ?03/21/2022, 8:47 PM ?

## 2022-03-21 NOTE — Plan of Care (Signed)
?  Problem: Education: ?Goal: Knowledge of General Education information will improve ?Description: Including pain rating scale, medication(s)/side effects and non-pharmacologic comfort measures ?Outcome: Progressing ?  ?Problem: Safety: ?Goal: Ability to remain free from injury will improve ?Outcome: Progressing ?  ?Problem: Medication: ?Goal: Compliance with prescribed medication regimen will improve ?Outcome: Progressing ?  ?Problem: Self-Concept: ?Goal: Ability to disclose and discuss suicidal ideas will improve ?Outcome: Progressing ?  ?

## 2022-03-21 NOTE — Progress Notes (Signed)
Pt cooperative and med compliant. Pt asked questions regarding zyprexa and received a printed educational handout about medication. Reviewed handout and information with patient. Patient verbalized understanding. Pt stated he slept well last night and has been eating all meals. Pt rated his depression a 5 out of 10 and his anxiety a 4 out 10. Pt stated his goal is to "get adjusted to my meds." Patient spends time in dayroom watching tv or in his room reading. Denies SI and HI with no plan/intent. Patient denied any auditory hallucinations at this moment but did state "last one was 2 days ago." Patient denies any command hallucinations. Q15 minute safety checks for safety.  ? ? 03/21/22 0800  ?Psych Admission Type (Psych Patients Only)  ?Admission Status Voluntary  ?Psychosocial Assessment  ?Patient Complaints None  ?Eye Contact Fair  ?Facial Expression Flat  ?Affect Blunted;Anxious  ?Speech Logical/coherent  ?Interaction Guarded  ?Motor Activity Slow  ?Appearance/Hygiene Disheveled  ?Behavior Characteristics Cooperative;Guarded  ?Mood Pleasant  ?Thought Process  ?Coherency Circumstantial  ?Content WDL  ?Delusions None reported or observed  ?Perception WDL  ?Hallucination Auditory ?("last one 2 days ago.")  ?Judgment Impaired  ?Confusion None  ?Danger to Self  ?Current suicidal ideation? Denies  ?Danger to Others  ?Danger to Others None reported or observed  ?Danger to Others Abnormal  ?Harmful Behavior to others No threats or harm toward other people  ? ? ?

## 2022-03-21 NOTE — BH IP Treatment Plan (Signed)
Interdisciplinary Treatment and Diagnostic Plan Update ? ?03/21/2022 ?Time of Session: 9:25am  ?Rickey Miller ?MRN: [id number] ? ?Principal Diagnosis: Psychosis ([place]) ? ?Secondary Diagnoses: Principal Problem: ?  Psychosis ([place]) ? ? ?Current Medications:  ?Current Facility-Administered Medications  ?Medication Dose Route Frequency Provider Last Rate Last Admin  ? acetaminophen (TYLENOL) tablet 650 mg  650 mg Oral Q6H PRN Suella Broad, FNP   650 mg at 03/20/22 2033  ? alum & mag hydroxide-simeth (MAALOX/MYLANTA) 200-200-20 MG/5ML suspension 30 mL  30 mL Oral Q6H PRN Burt Ek, Gayland Curry, FNP      ? diphenhydrAMINE (BENADRYL) capsule 50 mg  50 mg Oral Q6H PRN Suella Broad, FNP      ? hydrOXYzine (ATARAX) tablet 25 mg  25 mg Oral TID PRN Charmaine Downs C, NP   25 mg at 03/20/22 2033  ? LORazepam (ATIVAN) injection 1-2 mg  1-2 mg Intravenous Q4H PRN Burt Ek Gayland Curry, FNP      ? ziprasidone (GEODON) injection 20 mg  20 mg Intramuscular Q12H PRN Suella Broad, FNP      ? And  ? LORazepam (ATIVAN) tablet 1 mg  1 mg Oral PRN Burt Ek Gayland Curry, FNP      ? magnesium hydroxide (MILK OF MAGNESIA) suspension 30 mL  30 mL Oral Daily PRN Starkes-Perry, Gayland Curry, FNP      ? nicotine (NICODERM CQ - dosed in mg/24 hours) patch 21 mg  21 mg Transdermal Daily Suella Broad, FNP   21 mg at 03/21/22 0800  ? OLANZapine zydis (ZYPREXA) disintegrating tablet 5 mg  5 mg Oral BID Suella Broad, FNP   5 mg at 03/21/22 0800  ? traZODone (DESYREL) tablet 50 mg  50 mg Oral QHS PRN Charmaine Downs C, NP   50 mg at 03/20/22 2033  ? ?PTA Medications: ?Medications Prior to Admission  ?Medication Sig Dispense Refill Last Dose  ? acetaminophen (TYLENOL) 325 MG tablet Take 2 tablets (650 mg total) by mouth every 6 (six) hours as needed for mild pain, fever or headache.     ? OLANZapine zydis (ZYPREXA) 5 MG disintegrating tablet Take 1 tablet (5 mg total) by mouth 2 (two) times daily.      ? ? ?Patient Stressors:   ? ?Patient Strengths:   ? ?Treatment Modalities: Medication Management, Group therapy, Case management,  ?1 to 1 session with clinician, Psychoeducation, Recreational therapy. ? ? ?Physician Treatment Plan for Primary Diagnosis: Psychosis ([place]) ?Long Term Goal(s):    ? ?Short Term Goals:   ? ?Medication Management: Evaluate patient's response, side effects, and tolerance of medication regimen. ? ?Therapeutic Interventions: 1 to 1 sessions, Unit Group sessions and Medication administration. ? ?Evaluation of Outcomes: Not Met ? ?Physician Treatment Plan for Secondary Diagnosis: Principal Problem: ?  Psychosis ([place]) ? ?Long Term Goal(s):    ? ?Short Term Goals:      ? ?Medication Management: Evaluate patient's response, side effects, and tolerance of medication regimen. ? ?Therapeutic Interventions: 1 to 1 sessions, Unit Group sessions and Medication administration. ? ?Evaluation of Outcomes: Not Met ? ? ?RN Treatment Plan for Primary Diagnosis: Psychosis ([place]) ?Long Term Goal(s): Knowledge of disease and therapeutic regimen to maintain health will improve ? ?Short Term Goals: Ability to remain free from injury will improve, Ability to participate in decision making will improve, Ability to verbalize feelings will improve, Ability to disclose and discuss suicidal ideas, and Ability to identify and develop effective coping behaviors will improve ? ?Medication Management: RN will  administer medications as ordered by provider, will assess and evaluate patient's response and provide education to patient for prescribed medication. RN will report any adverse and/or side effects to prescribing provider. ? ?Therapeutic Interventions: 1 on 1 counseling sessions, Psychoeducation, Medication administration, Evaluate responses to treatment, Monitor vital signs and CBGs as ordered, Perform/monitor CIWA, COWS, AIMS and Fall Risk screenings as ordered, Perform wound care treatments as ordered. ? ?Evaluation  of Outcomes: Not Met ? ? ?LCSW Treatment Plan for Primary Diagnosis: Psychosis ([place]) ?Long Term Goal(s): Safe transition to appropriate next level of care at discharge, Engage patient in therapeutic group addressing interpersonal concerns. ? ?Short Term Goals: Engage patient in aftercare planning with referrals and resources, Increase social support, Increase emotional regulation, Facilitate acceptance of mental health diagnosis and concerns, Identify triggers associated with mental health/substance abuse issues, and Increase skills for wellness and recovery ? ?Therapeutic Interventions: Assess for all discharge needs, 1 to 1 time with Education officer, museum, Explore available resources and support systems, Assess for adequacy in community support network, Educate family and significant other(s) on suicide prevention, Complete Psychosocial Assessment, Interpersonal group therapy. ? ?Evaluation of Outcomes: Not Met ? ? ?Progress in Treatment: ?Attending groups: Yes. ?Participating in groups: Yes. ?Taking medication as prescribed: Yes. ?Toleration medication: Yes. ?Family/Significant other contact made: Yes, individual(s) contacted:  if consents are provided  ?Patient understands diagnosis: No. ?Discussing patient identified problems/goals with staff: Yes. ?Medical problems stabilized or resolved: Yes. ?Denies suicidal/homicidal ideation: Yes. ?Issues/concerns per patient self-inventory: No. ? ? ?New problem(s) identified: No, Describe:  None  ? ?New Short Term/Long Term Goal(s): medication stabilization, elimination of SI thoughts, development of comprehensive mental wellness plan.  ? ?Patient Goals: "To get on the right medications"  ? ?Discharge Plan or Barriers: Patient recently admitted. CSW will continue to follow and assess for appropriate referrals and possible discharge planning.  ? ?Reason for Continuation of Hospitalization: Anxiety ?Delusions  ?Medication stabilization ?Suicidal ideation ? ?Estimated Length of  Stay: 3 to 5 days  ? ?Last 3 [place] Suicide Severity Risk Score: ?[facility] Admission (Current) from 03/20/2022 in [facility] 500B ED to Hosp-Admission (Discharged) from 03/17/2022 in [facility] PCU  ?C-SSRS RISK CATEGORY No Risk No Risk  ? ?  ? ? ?Last PHQ 2/9 Scores: ?   ? View : No data to display.  ?  ?  ?  ? ? ?Scribe for Treatment Team: ?Darleen Crocker, LCSWA ?03/21/2022 ?2:34 PM ? ? ?
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
## 2022-03-21 NOTE — Group Note (Signed)
Recreation Therapy Group Note ? ? ?Group Topic:Communication  ?Group Date: 03/21/2022 ?Start Time: 1000 ?End Time: 1035 ?Facilitators: Caroll Rancher, LRT,CTRS ?Location: [address] Dayroom ? ? ?oal Area(s) Addresses:  ?Patient will effectively listen to complete activity.  ?Patient will identify communication skills used to make activity successful.  ?Patient will identify how skills used during activity can be used to reach post d/c goals.  ?  ?Group Description:  Geometric Drawings.  Three volunteers from the peer group will be shown an abstract picture with a particular arrangement of geometrical shapes.  Each round, one 'speaker' will describe the pattern, as accurately as possible without revealing the image to the group.  The remaining group members will listen and draw the picture to reflect how it is described to them. Patients with the role of 'listener' cannot ask clarifying questions but, may request that the speaker repeat a direction. Once the drawings are complete, the presenter will show the rest of the group the picture and compare how close each person came to drawing the picture. LRT will facilitate a post-activity discussion regarding effective communication and the importance of planning, listening, and asking for clarification in daily interactions with others. ? ? ?Affect/Mood: Appropriate ?  ?Participation Level: Engaged ?  ?Participation Quality: Independent ?  ?Behavior: Appropriate ?  ?Speech/Thought Process: Focused ?  ?Insight: Good ?  ?Judgement: Good ?  ?Modes of Intervention: Drawing ?  ?Patient Response to Interventions:  Engaged ?  ?Education Outcome: ? Acknowledges education and In group clarification offered   ? ?Clinical Observations/Individualized Feedback: Pt was engaged but a little quiet.  Pt presented the first two drawings to the group.  Pt was very detailed and presented the pictures well to the group.  Pt expressed in general conversation, his response would depend on the  topic.  Pt went on to explain if the person was giving directions, he would try to get them to be detailed in what they were trying to relay to him.  ? ? ?Plan: Continue to engage patient in RT group sessions 2-3x/week. ? ? ?Caroll Rancher, LRT,CTRS  ?03/21/2022 1:50 PM ?

## 2022-03-21 NOTE — Progress Notes (Signed)
?   03/21/22 0500  ?Sleep  ?Number of Hours 7.25  ? ? ?

## 2022-03-21 NOTE — Progress Notes (Signed)
?   03/21/22 2100  ?Psych Admission Type (Psych Patients Only)  ?Admission Status Voluntary  ?Psychosocial Assessment  ?Patient Complaints None  ?Eye Contact Fair  ?Facial Expression Anxious  ?Affect Anxious  ?Speech Slow  ?Interaction Cautious  ?Motor Activity Slow  ?Appearance/Hygiene Disheveled  ?Behavior Characteristics Cooperative;Anxious  ?Mood Pleasant  ?Thought Process  ?Coherency Blocking;Circumstantial  ?Content WDL  ?Delusions Paranoid  ?Perception WDL  ?Hallucination None reported or observed  ?Judgment Impaired  ?Confusion WDL  ?Danger to Self  ?Current suicidal ideation? Denies  ?Danger to Others  ?Danger to Others None reported or observed  ? ? ?

## 2022-03-21 NOTE — Progress Notes (Signed)
Pt was encouraged but didn't attend orientation/goals group. ?

## 2022-03-21 NOTE — Progress Notes (Signed)
Recreation Therapy Notes ? ?INPATIENT RECREATION THERAPY ASSESSMENT ? ?Patient Details ?Name: Rickey Miller ?MRN: [id number] ?DOB: 1991/11/30 ?Today's Date: 03/21/2022 ?      ?Information Obtained From: ?Patient ? ?Able to Participate in Assessment/Interview: ?Yes ? ?Patient Presentation: ?Alert (Reserved) ? ?Reason for Admission (Per Patient): ?Other (Comments) ("took 12 200mg  Seraqel) ? ?Patient Stressors: ?Other (Comment) ("life") ? ?Coping Skills:   ?Isolation, Self-Injury, TV, Sports, Music, Exercise, Substance Abuse, Talk, Prayer, Avoidance, Hot Bath/Shower ? ?Leisure Interests (2+):  ?[employer], [employer], [place] ? ?Frequency of Recreation/Participation: ?Other (Comment) (Fishing/Hunting- Haven't been in a couple of weeks; Play instruments- Not since last year) ? ?Awareness of Community Resources:  ?Yes ? ?Community Resources:  ?Park, Patent examiner, [place] ? ?Current Use: ?Yes ? ?If no, Barriers?: ?  ? ?Expressed Interest in [employer] Information: ?No ? ?[place] of Residence:  ?[place] ? ?Patient Main Form of Transportation: ?Diplomatic Services operational officer (Walk) ? ?Patient Strengths:  ?Survival Skills ? ?Patient Identified Areas of Improvement:  ?Communication- "without showing emotions behind how I feel" ? ?Patient Goal for Hospitalization:  ?"to get back on medications, get out of here" ? ?Current SI (including self-harm):  ?No ? ?Current HI:  ?No ? ?Current AVH: ?No ? ?Staff Intervention Plan: ?Group Attendance, Collaborate with Interdisciplinary Treatment Team ? ?Consent to Intern Participation: ?N/A ? ? ?Victorino Sparrow, LRT,CTRS ?Victorino Sparrow A ?03/21/2022, 2:12 PM ?

## 2022-03-21 NOTE — Group Note (Signed)
[facility] LCSW Group Therapy Note ? ?Date/Time: 1pm ? ?Type of Therapy and Topic:  Group Therapy:  Strengths and Qualities ? ?Participation Level:  Did not attend ? ?Description of Group:   ? In this group patients will be asked to explore and define the terms strength ans qualities.  Patients will be guided to discuss their thoughts, feelings, and behaviors as to where strengths and qualities originate. Participants will then list some of their strengths and qualities related to each subject topic. This group will be process-oriented, with patients participating in exploration of their own experiences as well as giving and receiving support and challenge from other group members. ? ?Therapeutic Goals: ?Patient will identify specific strengths related to their personal life. ?Patient will identify feelings, thoughts, and beliefs about strengths and qualities. ?Patient will identify ways their strengths have been used. . ?Patient will identify situations where they have helped others or made someone else happy. . ? ?Summary of Patient Progress ?Did not attend ? ? ? ? ?Therapeutic Modalities:   ?Cognitive Behavioral Therapy ?Solution Focused Therapy ?Motivational Interviewing ?Brief Therapy ? ?
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
## 2022-03-22 DIAGNOSIS — F209 Schizophrenia, unspecified: Secondary | ICD-10-CM

## 2022-03-22 NOTE — [facility] Suicide Risk Assessment (Signed)
[facility] INPATIENT:  Family/Significant Other Suicide Prevention Education ? ?Suicide Prevention Education:  ?Education Completed; Alda Berthold, [phone])   (name of family member/significant other) has been identified by the patient as the family member/significant other with whom the patient will be residing, and identified as the person(s) who will aid the patient in the event of a mental health crisis (suicidal ideations/suicide attempt).  With written consent from the patient, the family member/significant other has been provided the following suicide prevention education, prior to the and/or following the discharge of the patient. ? ?CSW spoke with patient friend, Alda Berthold.  Alda Berthold reports that they haven't known each other that long but met and got a long due to being in similar situations (Homeless, rough life, no support).  Alda Berthold reports that family has minimal family support and knows that he has had mental health issues in the past.  Alda Berthold reports that she has spoken to him since he has been in the hospital and patient sounds like he is doing a lot better.  She reports that he has a plan to go and get his ID and then go and get a job and has discussed with him that he can call her as he feels like this.  Alda Berthold has no further safety concerns and reports that patient does not have access to guns/weapons.  ? ?The suicide prevention education provided includes the following: ?Suicide risk factors ?Suicide prevention and interventions ?National Suicide Hotline telephone number ?[facility] assessment telephone number ?[facility] Emergency Assistance 911 ?[place] and/or Residential Mobile Crisis Unit telephone number ? ?Request made of family/significant other to: ?Remove weapons (e.g., guns, rifles, knives), all items previously/currently identified as safety concern.   ?Remove drugs/medications (over-the-counter, prescriptions, illicit drugs), all items previously/currently identified as a safety  concern. ? ?The family member/significant other verbalizes understanding of the suicide prevention education information provided.  The family member/significant other agrees to remove the items of safety concern listed above. ? ?Miken Stecher E Dianah Pruett ?03/22/2022, 1:17 PM ?

## 2022-03-22 NOTE — [facility] Group Notes (Signed)
[facility] Group Notes:  (Nursing/MHT/Case Management/Adjunct) ? ?Date:  03/22/2022  ?Time:  1:11 PM ? ?Type of Therapy:   Orientation/Goals group ? ?Participation Level:  Active ? ?Participation Quality:  Appropriate ? ?Affect:  Appropriate ? ?Cognitive:  Appropriate ? ?Insight:  Appropriate ? ?Engagement in Group:  Engaged ? ?Modes of Intervention:  Discussion, Education, Orientation, and Support ? ?Summary of Progress/Problems:Pt goal for today is to stay positive and stay focused.  ? ?Rickey Miller ?03/22/2022, 1:11 PM ?

## 2022-03-22 NOTE — Progress Notes (Signed)
[facility] MD Progress Note ? ?03/22/2022 12:46 PM ?[name]  ?MRN:  [id number] ?Subjective:  Patient is a 31 yr old male with reported past psychiatric history significant for schizophrenia, Bipolar disorder and anxiety initially presented to [facility] on 4/15 with concern of overdose. He was assessed by psychiatry and was recommended for inpatient psychiatric admission.  Patient was admitted to [facility] H adult unit on 03/20/2022. ? ?On assessment this AM patient reports that he is feeling "so-so" and endorses that he is still feeling a bit irritable and agitated. Patient reports that he is going to group and that he has not particularly enjoyed one, but he goes to be occupied. Patient reports that he has no problems with group. Patient reports that he recalls overdosing in his suicide attempt, but today denies SI, HI and AVH. Patient endorses that being in the hospital is his protective factor and endorses that he will continue to take his medications. Patient reports that he had not been compliant with his Seroquel prior to his OD. Patient denies having any adverse side effects to the Zyprexa.  ? ?Principal Problem: Schizoaffective disorder (HCC) ?Diagnosis: Principal Problem: ?  Schizoaffective disorder (HCC) ?Active Problems: ?  Polysubstance abuse (HCC) ?  Cocaine abuse (HCC) ?  Cannabis abuse ?  Social anxiety disorder ?  GAD (generalized anxiety disorder) ?  Nicotine dependence ?  Alcohol use disorder ? ?Total Time spent with patient: 20 minutes ? ?Past Psychiatric History: See H&P ? ?Past Medical History: History reviewed. No pertinent past medical history.  ?Past Surgical History:  ?Procedure Laterality Date  ? TONSILLECTOMY    ? ?Family History: History reviewed. No pertinent family history. ?Family Psychiatric  History: See H&P ?Social History:  ?Social History  ? ?Substance and Sexual Activity  ?Alcohol Use Yes  ? Comment: Friday, Sat  ?   ?Social History  ? ?Substance and Sexual Activity  ?Drug Use Yes  ? Types:  Marijuana  ? Comment: weekly; unable to quantify  ?  ?Social History  ? ?Socioeconomic History  ? Marital status: Unknown  ?  Spouse name: Not on file  ? Number of children: Not on file  ? Years of education: Not on file  ? Highest education level: Not on file  ?Occupational History  ? Not on file  ?Tobacco Use  ? Smoking status: Every Day  ?  Packs/day: 1.00  ?  Years: 13.00  ?  Pack years: 13.00  ?  Types: Cigarettes  ? Smokeless tobacco: Never  ?Vaping Use  ? Vaping Use: Former  ?Substance and Sexual Activity  ? Alcohol use: Yes  ?  Comment: Friday, Sat  ? Drug use: Yes  ?  Types: Marijuana  ?  Comment: weekly; unable to quantify  ? Sexual activity: Yes  ?  Birth control/protection: None  ?Other Topics Concern  ? Not on file  ?Social History Narrative  ? Not on file  ? ?Social Determinants of Health  ? ?Financial Resource Strain: Not on file  ?Food Insecurity: Not on file  ?Transportation Needs: Not on file  ?Physical Activity: Not on file  ?Stress: Not on file  ?Social Connections: Not on file  ? ?Additional Social History:  ?  ?  ?  ?  ?  ?  ?  ?  ?  ?  ?  ? ?Sleep: Good ? ?Appetite:  Good ? ?Current Medications: ?Current Facility-Administered Medications  ?Medication Dose Route Frequency Provider Last Rate Last Admin  ? acetaminophen (TYLENOL) tablet 650 mg  650 mg Oral Q6H PRN Maryagnes AmosStarkes-Perry, Takia S, FNP   650 mg at 03/20/22 2033  ? alum & mag hydroxide-simeth (MAALOX/MYLANTA) 200-200-20 MG/5ML suspension 30 mL  30 mL Oral Q6H PRN Rosario AdieStarkes-Perry, Juel Burrowakia S, FNP      ? diphenhydrAMINE (BENADRYL) capsule 50 mg  50 mg Oral Q6H PRN Maryagnes AmosStarkes-Perry, Takia S, FNP      ? hydrOXYzine (ATARAX) tablet 25 mg  25 mg Oral TID PRN Dahlia Byesnuoha, Josephine C, NP   25 mg at 03/21/22 2050  ? LORazepam (ATIVAN) injection 1-2 mg  1-2 mg Intravenous Q4H PRN Rosario AdieStarkes-Perry, Juel Burrowakia S, FNP      ? ziprasidone (GEODON) injection 20 mg  20 mg Intramuscular Q12H PRN Maryagnes AmosStarkes-Perry, Takia S, FNP      ? And  ? LORazepam (ATIVAN) tablet 1 mg  1 mg Oral  PRN Rosario AdieStarkes-Perry, Juel Burrowakia S, FNP      ? magnesium hydroxide (MILK OF MAGNESIA) suspension 30 mL  30 mL Oral Daily PRN Starkes-Perry, Juel Burrowakia S, FNP      ? nicotine (NICODERM CQ - dosed in mg/24 hours) patch 21 mg  21 mg Transdermal Daily Maryagnes AmosStarkes-Perry, Takia S, FNP   21 mg at 03/22/22 [id number]  ? OLANZapine zydis (ZYPREXA) disintegrating tablet 10 mg  10 mg Oral QHS Karsten Rooda, Vandana, MD   10 mg at 03/21/22 2050  ? OLANZapine zydis (ZYPREXA) disintegrating tablet 5 mg  5 mg Oral Daily Karsten Rooda, Vandana, MD   5 mg at 03/22/22 [id number]  ? thiamine tablet 100 mg  100 mg Oral Daily Karsten Rooda, Vandana, MD   100 mg at 03/22/22 [id number]  ? traZODone (DESYREL) tablet 50 mg  50 mg Oral QHS PRN Dahlia Byesnuoha, Josephine C, NP   50 mg at 03/21/22 2050  ? ? ?Lab Results:  ?Results for orders placed or performed during the hospital encounter of 03/20/22 (from the past 48 hour(s))  ?Lipid panel     Status: Abnormal  ? Collection Time: 03/21/22  6:28 AM  ?Result Value Ref Range  ? Cholesterol 184 0 - 200 mg/dL  ? Triglycerides 66 <150 mg/dL  ? HDL 55 >40 mg/dL  ? Total CHOL/HDL Ratio 3.3 RATIO  ? VLDL 13 0 - 40 mg/dL  ? LDL Cholesterol 116 (H) 0 - 99 mg/dL  ?  Comment:        ?Total Cholesterol/HDL:CHD Risk ?Coronary Heart Disease Risk Table ?                    Men   Women ? 1/2 Average Risk   3.4   3.3 ? Average Risk       5.0   4.4 ? 2 X Average Risk   9.6   7.1 ? 3 X Average Risk  23.4   11.0 ?       ?Use the calculated Patient Ratio ?above and the CHD Risk Table ?to determine the patient's CHD Risk. ?       ?ATP III CLASSIFICATION (LDL): ? <100     mg/dL   Optimal ? [id number]  mg/dL   Near or Above ?                   Optimal ? 130-159  mg/dL   Borderline ? 160-189  mg/dL   High ? >782>190     mg/dL   Very High ?Performed at [facility], [address] [place], [place] [address] ?  ?TSH     Status: None  ? Collection  Time: 03/21/22  6:28 AM  ?Result Value Ref Range  ? TSH 1.001 0.350 - 4.500 uIU/mL  ?  Comment: Performed by a 3rd Generation assay  with a functional sensitivity of <=0.01 uIU/mL. ?Performed at [facility], [address], [place], [place] [address] ?  ?Hemoglobin A1c     Status: None  ? Collection Time: 03/21/22  6:28 AM  ?Result Value Ref Range  ? Hgb A1c MFr Bld 4.8 4.8 - 5.6 %  ?  Comment: (NOTE) ?Pre diabetes:          5.7%-6.4% ? ?Diabetes:              >6.4% ? ?Glycemic control for   <7.0% ?adults with diabetes ?  ? Mean Plasma Glucose 91.06 mg/dL  ?  Comment: Performed at [facility], [address], [place], [place] [address]  ? ? ?Blood Alcohol level:  ?Lab Results  ?Component Value Date  ? ETH <10 03/17/2022  ? ? ?Metabolic Disorder Labs: ?Lab Results  ?Component Value Date  ? HGBA1C 4.8 03/21/2022  ? MPG 91.06 03/21/2022  ? ?No results found for: PROLACTIN ?Lab Results  ?Component Value Date  ? CHOL 184 03/21/2022  ? TRIG 66 03/21/2022  ? HDL 55 03/21/2022  ? CHOLHDL 3.3 03/21/2022  ? VLDL 13 03/21/2022  ? LDLCALC 116 (H) 03/21/2022  ? ? ?Physical Findings: ?AIMS:  , ,  ,  ,    ?CIWA:    ?COWS:    ? ?Musculoskeletal: ?Strength & Muscle Tone: within normal limits ?Gait & Station: normal ?Patient leans: N/A ? ?Psychiatric Specialty Exam: ? ?Presentation  ?General Appearance: Appropriate for Environment ? ?Eye Contact:-- (avoidant, but intermittnetly looks at provider) ? ?Speech:Clear and Coherent ? ?Speech Volume:Normal ? ?Handedness:No data recorded ? ?Mood and Affect  ?Mood:-- ("so-so") ? ?Affect:Appropriate ? ? ?Thought Process  ?Thought Processes:Goal Directed ? ?Descriptions of Associations:Circumstantial ? ?Orientation:Full (Time, Place and Person) ? ?Thought Content:Logical ? ?History of Schizophrenia/Schizoaffective disorder:Yes ? ?Duration of Psychotic Symptoms:Less than six months ? ?Hallucinations:Hallucinations: None ?Description of Auditory Hallucinations: Sometimes hears normal conversation from past, sometimes hears voices to hurt himself. ? ?Ideas of Reference:None ? ?Suicidal  Thoughts:Suicidal Thoughts: No ? ?Homicidal Thoughts:Homicidal Thoughts: No ? ? ?Sensorium  ?Memory:Immediate Fair; Recent Fair ? ?Judgment:-- (Improving) ? ?Insight:Shallow ? ? ?Executive Functions  ?Concentration:F

## 2022-03-22 NOTE — Group Note (Signed)
Recreation Therapy Group Note ? ? ?Group Topic:Team Building  ?Group Date: 03/22/2022 ?Start Time: 1000 ?End Time: 1040 ?Facilitators: Caroll Rancher, LRT,CTRS ?Location: [address] Day[address] ? ? ?Goal Area(s) Addresses:  ?Patient will effectively work with peer towards shared goal.  ?Patient will identify skills used to make activity successful.  ?Patient will identify how skills used during activity can be used to reach post d/c goals.  ? ?Group Description:  Landing Pad. In teams of 3-5, patients were given 12 plastic drinking straws and an equal length of masking tape. Using the materials provided, patients were asked to build a landing pad to catch a golf ball dropped from approximately 5 feet in the air. All materials were required to be used by the team in their design. LRT facilitated post-activity discussion. ? ? ?Affect/Mood: Appropriate ?  ?Participation Level: Engaged ?  ?Participation Quality: Independent ?  ?Behavior: Appropriate ?  ?Speech/Thought Process: Focused ?  ?Insight: Good ?  ?Judgement: Good ?  ?Modes of Intervention: STEM Activity ?  ?Patient Response to Interventions:  Engaged ?  ?Education Outcome: ? Acknowledges education and In group clarification offered   ? ?Clinical Observations/Individualized Feedback: Pt worked by himself to complete the activity.  Pt expressed using focus, concentration and having a vision of how he wanted his landing pad to look, to complete the activity.  Pt expressed with his support system, he would need to let them know what's going on with him and then make a plan.  Pt expressed they would then need to work the steps.  When asked what he wants to accomplish, pt stated he wants to day trading and play for a church.  ? ? ?Plan: Continue to engage patient in RT group sessions 2-3x/week. ? ? ?Caroll Rancher, LRT,CTRS ?03/22/2022 12:33 PM ?

## 2022-03-22 NOTE — Progress Notes (Signed)
[facility] Group Notes:  (Nursing/MHT/Case Management/Adjunct) ? ?Date:  03/22/2022  ?Time:  2015 ?Type of Therapy:   wrap up group ? ?Participation Level:  Active ? ?Participation Quality:  Appropriate, Attentive, Sharing, and Supportive ? ?Affect:  Appropriate ? ?Cognitive:  Alert ? ?Insight:  Improving ? ?Engagement in Group:  Engaged ? ?Modes of Intervention:  Clarification, Education, and Support ? ?Summary of Progress/Problems: Positive thinking and positive change were discussed.  ? ?Rickey Miller S ?03/22/2022, 9:10 PM ?

## 2022-03-22 NOTE — Progress Notes (Signed)
?   03/22/22 0500  ?Sleep  ?Number of Hours 7  ? ? ?

## 2022-03-22 NOTE — Progress Notes (Signed)
Pt A & O to self, place and situation. Denies SI, HI, AVH and pain. Appears to be preoccupied, suspicious /watchful and hypervigilant on interactions. Remains medication compliant and denies adverse drug reactions at this time. Pt did not attend scheduled groups despite multiple verbal redirections. Safety checks maintained at Q 15 minutes intervals without outburst. Support and encouragement provided to pt. Pt tolerates all PO intake well. Remains safe on and off unit. ?

## 2022-03-22 NOTE — Progress Notes (Signed)
?   03/22/22 2116  ?Psych Admission Type (Psych Patients Only)  ?Admission Status Voluntary  ?Psychosocial Assessment  ?Patient Complaints None  ?Eye Contact Fair  ?Facial Expression Flat  ?Affect Flat  ?Speech Logical/coherent;Soft  ?Interaction Cautious;Forwards little;Guarded;Minimal  ?Motor Activity Slow  ?Appearance/Hygiene Improved  ?Behavior Characteristics Cooperative;Appropriate to situation  ?Mood Pleasant  ?Thought Process  ?Coherency Blocking  ?Content WDL  ?Delusions Paranoid  ?Perception WDL  ?Hallucination None reported or observed  ?Judgment Impaired  ?Confusion None  ?Danger to Self  ?Current suicidal ideation? Denies  ?Danger to Others  ?Danger to Others None reported or observed  ?Danger to Others Abnormal  ?Harmful Behavior to others No threats or harm toward other people  ? ? ?

## 2022-03-23 MED ORDER — PRAZOSIN HCL 1 MG PO CAPS
1.0000 mg | ORAL_CAPSULE | Freq: Every day | ORAL | Status: DC
  Administered 2022-03-23 – 2022-03-25 (×3): 1 mg via ORAL
  Filled 2022-03-23 (×5): qty 1

## 2022-03-23 MED ORDER — GUANFACINE HCL ER 1 MG PO TB24
1.0000 mg | ORAL_TABLET | Freq: Every day | ORAL | Status: DC
  Administered 2022-03-23 – 2022-03-26 (×4): 1 mg via ORAL
  Filled 2022-03-23 (×6): qty 1

## 2022-03-23 NOTE — [facility] Counselor (Signed)
CSW met with patient.  CSW provided the Pt with a packet that contains information including shelter and housing resources, free and reduced price food information, clothing resources, crisis center information, a [place] card, and suicide prevention information.   ? ?CSW also provided resources for vocational rehab.  Patient agreeable to follow up on resources.  ? ? ?Dammon Makarewicz, LCSW, LCAS ?Clincal Social Worker  ?[facility] ? ? ?

## 2022-03-23 NOTE — Progress Notes (Signed)
?   03/23/22 2113  ?Psych Admission Type (Psych Patients Only)  ?Admission Status Voluntary  ?Psychosocial Assessment  ?Patient Complaints None  ?Eye Contact Fair  ?Facial Expression Other (Comment) ?(appropriate)  ?Affect Blunted  ?Speech Logical/coherent  ?Interaction Cautious;Minimal  ?Motor Activity Other (Comment) ?(WDL)  ?Appearance/Hygiene Improved  ?Behavior Characteristics Cooperative;Appropriate to situation  ?Mood Anxious  ?Thought Process  ?Coherency Circumstantial  ?Content WDL  ?Delusions Paranoid  ?Perception WDL  ?Hallucination None reported or observed  ?Judgment Impaired  ?Confusion None  ?Danger to Self  ?Current suicidal ideation? Denies  ?Danger to Others  ?Danger to Others None reported or observed  ? ? ?

## 2022-03-23 NOTE — [facility] Group Notes (Signed)
Adult Psychoeducational Group Note ? ?Date:  03/23/2022 ?Time:  10:26 AM ? ?Group Topic/Focus:  ?Goals Group:   The focus of this group is to help patients establish daily goals to achieve during treatment and discuss how the patient can incorporate goal setting into their daily lives to aide in recovery. ? ?Participation Level:  Active ? ?Participation Quality:  Appropriate ? ?Affect:  Appropriate ? ?Cognitive:  Appropriate ? ?Insight: Appropriate ? ?Engagement in Group:  Engaged ? ?Modes of Intervention:  Discussion ? ?Additional Comments:  Patient attended morning orientation/goal group and participated.  ? ?Andrya Roppolo W Lorenda Peck ?03/23/2022, 10:26 AM ?

## 2022-03-23 NOTE — Progress Notes (Addendum)
[facility] MD Progress Note ? ?03/23/2022 5:13 PM ?Rickey Miller  ?MRN:  [id number] ?Subjective:  Patient is a 31 yr old male with reported past psychiatric history significant for schizophrenia, Bipolar disorder and anxiety initially presented to [facility] on 4/15 with concern of overdose. He was assessed by psychiatry and was recommended for inpatient psychiatric admission.  Patient was admitted to [facility] H adult unit on 03/20/2022. ? ?On assessment this AM patient reports tha the is eating and sleeping well. Patient reports that he is having nightmares though and that they do bother him. Patient endorses he is reluctant to talk about the things that are bothering him, because he is nervous about how they sound. Patient reports that the nightmares are related to things that happened in his past. Patient reports that he is not having SI, HI, or AVH today, but reports that he lied and had VH yesterday, but was again afraid to tell anyone. Patient endorses that today he is still having racing thoughts but is trying to focus on thoughts that help relax him. Patient reports that he is still feeling very agitated and he does not know why. Patient reports he is trying to keep to himself because he is afraid he will hurt someone else. Patient reports he would rather hurt himself than someone else. Patient reports that he is hypervigilant and endorses that he thinks some of this is due to be in the penitentiary system and he is actively working to remind himself that the world is not like prison. Patient reports that this is difficult for him. Patient reports that he is trying his best to move forward, but endorses that he has had some difficulty the last few months with this but will continue to try. Patient reports he is open to therapy and PHP at discharge.  ? ? ?Staff note that patient is pleasant but very guarded. Patient is slowly assimilating to his new hall.  ? ?Principal Problem: Schizoaffective disorder (HCC) ?Diagnosis:  Principal Problem: ?  Schizoaffective disorder (HCC) ?Active Problems: ?  Polysubstance abuse (HCC) ?  Cocaine abuse (HCC) ?  Cannabis abuse ?  Social anxiety disorder ?  GAD (generalized anxiety disorder) ?  Nicotine dependence ?  Alcohol use disorder ? ?Total Time spent with patient: 30 minutes ? ?Past Psychiatric History: See H&P ? ?Past Medical History: History reviewed. No pertinent past medical history.  ?Past Surgical History:  ?Procedure Laterality Date  ? TONSILLECTOMY    ? ?Family History: History reviewed. No pertinent family history. ?Family Psychiatric  History: See H&P ?Social History:  ?Social History  ? ?Substance and Sexual Activity  ?Alcohol Use Yes  ? Comment: Friday, Sat  ?   ?Social History  ? ?Substance and Sexual Activity  ?Drug Use Yes  ? Types: Marijuana  ? Comment: weekly; unable to quantify  ?  ?Social History  ? ?Socioeconomic History  ? Marital status: Unknown  ?  Spouse name: Not on file  ? Number of children: Not on file  ? Years of education: Not on file  ? Highest education level: Not on file  ?Occupational History  ? Not on file  ?Tobacco Use  ? Smoking status: Every Day  ?  Packs/day: 1.00  ?  Years: 13.00  ?  Pack years: 13.00  ?  Types: Cigarettes  ? Smokeless tobacco: Never  ?Vaping Use  ? Vaping Use: Former  ?Substance and Sexual Activity  ? Alcohol use: Yes  ?  Comment: Friday, Sat  ? Drug use:  Yes  ?  Types: Marijuana  ?  Comment: weekly; unable to quantify  ? Sexual activity: Yes  ?  Birth control/protection: None  ?Other Topics Concern  ? Not on file  ?Social History Narrative  ? Not on file  ? ?Social Determinants of Health  ? ?Financial Resource Strain: Not on file  ?Food Insecurity: Not on file  ?Transportation Needs: Not on file  ?Physical Activity: Not on file  ?Stress: Not on file  ?Social Connections: Not on file  ? ?Additional Social History:  ?  ?  ?  ?  ?  ?  ?  ?  ?  ?  ?  ? ?Sleep: Fair ? ?Appetite:  Good ? ?Current Medications: ?Current Facility-Administered  Medications  ?Medication Dose Route Frequency Provider Last Rate Last Admin  ? acetaminophen (TYLENOL) tablet 650 mg  650 mg Oral Q6H PRN Maryagnes Amos, FNP   650 mg at 03/20/22 2033  ? alum & mag hydroxide-simeth (MAALOX/MYLANTA) 200-200-20 MG/5ML suspension 30 mL  30 mL Oral Q6H PRN Rosario Adie, Juel Burrow, FNP      ? diphenhydrAMINE (BENADRYL) capsule 50 mg  50 mg Oral Q6H PRN Maryagnes Amos, FNP      ? guanFACINE (INTUNIV) ER tablet 1 mg  1 mg Oral Daily Eliseo Gum B, MD   1 mg at 03/23/22 1207  ? hydrOXYzine (ATARAX) tablet 25 mg  25 mg Oral TID PRN Dahlia Byes C, NP   25 mg at 03/22/22 2116  ? LORazepam (ATIVAN) injection 1-2 mg  1-2 mg Intravenous Q4H PRN Rosario Adie Juel Burrow, FNP      ? ziprasidone (GEODON) injection 20 mg  20 mg Intramuscular Q12H PRN Maryagnes Amos, FNP      ? And  ? LORazepam (ATIVAN) tablet 1 mg  1 mg Oral PRN Rosario Adie Juel Burrow, FNP      ? magnesium hydroxide (MILK OF MAGNESIA) suspension 30 mL  30 mL Oral Daily PRN Starkes-Perry, Juel Burrow, FNP      ? nicotine (NICODERM CQ - dosed in mg/24 hours) patch 21 mg  21 mg Transdermal Daily Maryagnes Amos, FNP   21 mg at 03/23/22 0835  ? OLANZapine zydis (ZYPREXA) disintegrating tablet 10 mg  10 mg Oral QHS Karsten Ro, MD   10 mg at 03/22/22 2116  ? OLANZapine zydis (ZYPREXA) disintegrating tablet 5 mg  5 mg Oral Daily Karsten Ro, MD   5 mg at 03/23/22 0837  ? prazosin (MINIPRESS) capsule 1 mg  1 mg Oral QHS Yekaterina Escutia B, MD      ? thiamine tablet 100 mg  100 mg Oral Daily Karsten Ro, MD   100 mg at 03/23/22 0836  ? traZODone (DESYREL) tablet 50 mg  50 mg Oral QHS PRN Dahlia Byes C, NP   50 mg at 03/22/22 2116  ? ? ?Lab Results: No results found for this or any previous visit (from the past 48 hour(s)). ? ?Blood Alcohol level:  ?Lab Results  ?Component Value Date  ? ETH <10 03/17/2022  ? ? ?Metabolic Disorder Labs: ?Lab Results  ?Component Value Date  ? HGBA1C 4.8 03/21/2022  ? MPG  91.06 03/21/2022  ? ?No results found for: PROLACTIN ?Lab Results  ?Component Value Date  ? CHOL 184 03/21/2022  ? TRIG 66 03/21/2022  ? HDL 55 03/21/2022  ? CHOLHDL 3.3 03/21/2022  ? VLDL 13 03/21/2022  ? LDLCALC 116 (H) 03/21/2022  ? ? ?Physical Findings: ?AIMS:  , ,  ,  ,    ?  CIWA:    ?COWS:    ? ?Musculoskeletal: ?Strength & Muscle Tone: within normal limits ?Gait & Station: normal ?Patient leans: N/A ? ?Psychiatric Specialty Exam: ? ?Presentation  ?General Appearance: Appropriate for Environment; Casual ? ?Eye Contact:Minimal ? ?Speech:Clear and Coherent ? ?Speech Volume:Normal ? ?Handedness:No data recorded ? ?Mood and Affect  ?Mood:Irritable ? ?Affect:Restricted (guarded) ? ? ?Thought Process  ?Thought Processes:Goal Directed ? ?Descriptions of Associations:Circumstantial ? ?Orientation:Full (Time, Place and Person) ? ?Thought Content:Logical ? ?History of Schizophrenia/Schizoaffective disorder:Yes ? ?Duration of Psychotic Symptoms:Less than six months ? ?Hallucinations:Hallucinations: None ? ?Ideas of Reference:None ? ?Suicidal Thoughts:Suicidal Thoughts: No ? ?Homicidal Thoughts:Homicidal Thoughts: No ? ? ?Sensorium  ?Memory:Immediate Good; Recent Good ? ?Judgment:Fair ? ?Insight:Shallow ? ? ?Executive Functions  ?Concentration:Fair ? ?Attention Span:Fair ? ?Recall:Fair ? ?Fund of Knowledge:Good ? ?Language:Good ? ? ?Psychomotor Activity  ?Psychomotor Activity:Psychomotor Activity: Psychomotor Retardation ? ? ?Assets  ?Assets:Resilience; Desire for Improvement ? ? ?Sleep  ?Sleep:Sleep: Fair ?Number of Hours of Sleep: 6.75 ? ? ? ?Physical Exam: ?Physical Exam ?HENT:  ?   Head: Normocephalic and atraumatic.  ?Pulmonary:  ?   Effort: Pulmonary effort is normal.  ?Neurological:  ?   Mental Status: He is alert and oriented to person, place, and time.  ? ?Review of Systems  ?Psychiatric/Behavioral:  Negative for hallucinations and suicidal ideas. The patient does not have insomnia.   ?Blood pressure 121/69, pulse  76, temperature 98.6 ?F (37 ?C), temperature source Oral, resp. rate 18, height 5' 8.5" (1.74 m), weight 77.9 kg, SpO2 96 %. Body mass index is 25.74 kg/m?. ? ? ?Treatment Plan Summary: ?Daily contact with p

## 2022-03-23 NOTE — Group Note (Signed)
LCSW Group Therapy Note ? ? ?Group Date: 03/23/2022 ?Start Time: 1300 ?End Time: 1400 ? ?Type of Therapy and Topic: Group Therapy: Effective Communication ?  ?Participation Level: Active ?  ?Description of Group:  ?In this group patients will be asked to identify their own styles of communication as well as defining and identifying passive, assertive, and aggressive styles of communication. Participants will identify strategies to communicate in a more assertive manner in an effort to appropriately meet their needs. This group will be process-oriented, with patients participating in exploration of their own experiences as well as giving and receiving support and challenge from other group members. ?  ?Therapeutic Goals: ?1. Patient will identify their personal communication style. ?2. Patient will identify passive, assertive, and aggressive forms of communication. ?3. Patient will identify strategies for developing more effective communication to appropriately meet their needs.  ?  ?Summary of Patient Progress The Pt attended group but came into group late.  The Pt accepted the worksheet and followed along throughout the group session.  The Pt demonstrated knowledge of the topic being discussed and was appropriate with their peers.  The Pt was able to identify ways they can improve their communications skills and be more assertive when communicating their needs.  ? ?Aram Beecham, LCSWA ?03/23/2022  1:47 PM   ? ?

## 2022-03-23 NOTE — [facility] Group Notes (Signed)
Pt attended AA meeting.  

## 2022-03-24 NOTE — [facility] Group Notes (Signed)
Goals Group ?4/22//2023 ? ? ?Group Focus: affirmation, clarity of thought, and goals/reality orientation ?Treatment Modality:  Psychoeducation ?Interventions utilized were assignment, group exercise, and support ?Purpose: To be able to understand and verbalize the reason for their admission to the hospital. To understand that the medication helps with their chemical imbalance but they also need to work on their choices in life. To be challenged to develop a list of 30 positives about themselves. Also introduce the concept that "feelings" are not reality. ? ?Participation Level:  Active ? ?Participation Quality:  Appropriate ? ?Affect:  Appropriate ? ?Cognitive:  Appropriate ? ?Insight:  Improving ? ?Engagement in Group:  Engaged ? ?Additional Comments:  Rates energy at a 5/10. Participated fully in the group. ? ?Rickey Miller A ?

## 2022-03-24 NOTE — [facility] Group Notes (Signed)
.  Psychoeducational Group Note ? ? ? ?Date:  4/22//23 ?Time: 1300-1400 ? ? ? ?Purpose of Group: . The group focus' on teaching patients on how to identify their needs and their Life Skills:  Miller group where two lists are made. What people need and what are things that we do that are unhealthy. The lists are developed by the patients and it is explained that we often do the actions that are not healthy to get our list of needs met. ? ?Goal:: to develop the coping skills needed to get their needs met ? ?Participation Level:  Active ? ?Participation Quality:  Appropriate ? ?Affect:  Appropriate ? ?Cognitive:  Oriented ? ?Insight:  Improving ? ?Engagement in Group:  Engaged ? ?Additional Comments: Rates his energy at Miller 4/10. Was upset when asked to go to group and sat for the first 30 mins with his eyes closed. Then got up and started to participate. Joined in fully  ? ?Rickey Miller ?

## 2022-03-24 NOTE — Progress Notes (Signed)
D. Pt presents with a sad affect/anxious mood-forwards little and is guarded, but is calm and cooperative during interactions. Per pt's self inventory, pt rated his depression, hopelessness and anxiety all 6's today, and wrote that his goal was "to stay focused by concentrating." Pt has been visible in the milieu today, observed attending groups.  Pt currently denies SI/HI and AVH, and does not appear to be responding to internal stimuli. ?A. Labs and vitals monitored. Pt given and educated on medications. Pt supported emotionally and encouraged to express concerns and ask questions.   ?R. Pt remains safe with 15 minute checks. Will continue POC. ? ?  ?

## 2022-03-24 NOTE — Group Note (Signed)
LCSW Group Therapy Note ? ?03/24/2022    10:00-11:00am  ? ?Type of Therapy and Topic:  Group Therapy: Early Messages Received About Anger ? ?Participation Level:  Active ? ? ?Description of Group:   ?In this group, patients shared and discussed the early messages received in their lives about anger through parental or other adult modeling, teaching, repression, punishment, violence, and more.  Participants identified how those childhood lessons influence even now how they usually or often react when angered.  The group discussed that anger is a secondary emotion and what may be the underlying emotional themes that come out through anger outbursts or that are ignored through anger suppression.   ? ?Therapeutic Goals: ?Patients will identify one or more childhood message about anger that they received and how it was taught to them. ?Patients will discuss how these childhood experiences have influenced and continue to influence their own expression or repression of anger even today. ?Patients will explore possible primary emotions that tend to fuel their secondary emotion of anger. ?Patients will learn that anger itself is normal and cannot be eliminated, and that healthier coping skills can assist with resolving conflict rather than worsening situations. ? ?Summary of Patient Progress:  The patient shared that his childhood lessons about anger were (1) that you don't have to be rude when you are angry, and (2) you absolutely must keep your hands to yourself.  As a result, he avoids conflict and will walk away rather than participate in a confrontation that becomes physical.  He added often to the group, emphasizing often that we never know what the other person has been through or what they might be capable of, so it is always better to walk away rather than risk your life.  The patient participated fully and demonstrated insight. ? ?Therapeutic Modalities:   ?Cognitive Behavioral Therapy ?Motivation  Interviewing ? ?Berlin Hun Grossman-Orr  ?Marland Kitchen  ?

## 2022-03-24 NOTE — Progress Notes (Signed)
[facility] ? ?03/24/2022 1:33 PM ?Rickey Miller  ?MRN:  [id number] ?Subjective:  Patient is a 31 yr old male with reported past psychiatric history significant for schizophrenia, Bipolar disorder and anxiety [facility] with concern of overdose. He was assessed by psychiatry and was recommended for inpatient psychiatric admission.  [facility]. ? ?Patient seen this afternoon on rounds. He has no new physical complaints today. He feels that he is doing "fine" today, and that this is an improvement from yesterday. He does not elaborate on specifics. He generally does not discuss what he thinks about. He does feel hopefull about participating in a day program for drug addiction. He went to a group this morning and it went okay. He continues to stick to himself. He has not had any hallucinations so far today. No thoughts of harm to self or others.  ? ? ?Staff note that patient is pleasant but guarded. Patient is medication compliant.  ? ?Principal Problem: Schizoaffective disorder (HCC) ?Diagnosis: Principal Problem: ?  Schizoaffective disorder (HCC) ?Active Problems: ?  Polysubstance abuse (HCC) ?  Cocaine abuse (HCC) ?  Cannabis abuse ?  Social anxiety disorder ?  GAD (generalized anxiety disorder) ?  Nicotine dependence ?  Alcohol use disorder ? ?Total Time spent with patient: 15 minutes ? ?Past Psychiatric History: See H&P ? ?Past Medical History: History reviewed. No pertinent past medical history.  ?Past Surgical History:  ?Procedure Laterality Date  ? TONSILLECTOMY    ? ?Family History: History reviewed. No pertinent family history. ?Family Psychiatric  History: See H&P ?Social History:  ?Social History  ? ?Substance and Sexual Activity  ?Alcohol Use Yes  ? Comment: Friday, Sat  ?   ?Social History  ? ?Substance and Sexual Activity  ?Drug Use Yes  ? Types: Marijuana  ? Comment: weekly; unable to quantify  ?  ?Social History   ? ?Socioeconomic History  ? Marital status: Unknown  ?  Spouse name: Not on file  ? Number of children: Not on file  ? Years of education: Not on file  ? Highest education level: Not on file  ?Occupational History  ? Not on file  ?Tobacco Use  ? Smoking status: Every Day  ?  Packs/day: 1.00  ?  Years: 13.00  ?  Pack years: 13.00  ?  Types: Cigarettes  ? Smokeless tobacco: Never  ?Vaping Use  ? Vaping Use: Former  ?Substance and Sexual Activity  ? Alcohol use: Yes  ?  Comment: Friday, Sat  ? Drug use: Yes  ?  Types: Marijuana  ?  Comment: weekly; unable to quantify  ? Sexual activity: Yes  ?  Birth control/protection: None  ?Other Topics Concern  ? Not on file  ?Social History Narrative  ? Not on file  ? ?Social Determinants of Health  ? ?Financial Resource Strain: Not on file  ?Food Insecurity: Not on file  ?Transportation Needs: Not on file  ?Physical Activity: Not on file  ?Stress: Not on file  ?Social Connections: Not on file  ? ?Additional Social History:  ?  ?  ?  ?  ?  ?  ?  ?  ?  ?  ?  ? ?Sleep: Fair ? ?Appetite:  Good ? ?Current Medications: ?Current Facility-Administered Medications  ?Medication Dose Route Frequency Provider Last Rate Last Admin  ? acetaminophen (TYLENOL) tablet 650 mg  650 mg Oral Q6H PRN Rosario AdieStarkes-Perry, Juel Burrowakia S, FNP  650 mg at 03/20/22 2033  ? alum & mag hydroxide-simeth (MAALOX/MYLANTA) 200-200-20 MG/5ML suspension 30 mL  30 mL Oral Q6H PRN Rosario Adie, Juel Burrow, FNP      ? diphenhydrAMINE (BENADRYL) capsule 50 mg  50 mg Oral Q6H PRN Maryagnes Amos, FNP      ? guanFACINE (INTUNIV) ER tablet 1 mg  1 mg Oral Daily Eliseo Gum B, MD   1 mg at 03/24/22 0741  ? hydrOXYzine (ATARAX) tablet 25 mg  25 mg Oral TID PRN Dahlia Byes C, NP   25 mg at 03/23/22 2113  ? LORazepam (ATIVAN) injection 1-2 mg  1-2 mg Intravenous Q4H PRN Rosario Adie Juel Burrow, FNP      ? ziprasidone (GEODON) injection 20 mg  20 mg Intramuscular Q12H PRN Maryagnes Amos, FNP      ? And  ? LORazepam  (ATIVAN) tablet 1 mg  1 mg Oral PRN Rosario Adie Juel Burrow, FNP      ? magnesium hydroxide (MILK OF MAGNESIA) suspension 30 mL  30 mL Oral Daily PRN Starkes-Perry, Juel Burrow, FNP      ? nicotine (NICODERM CQ - dosed in mg/24 hours) patch 21 mg  21 mg Transdermal Daily Maryagnes Amos, FNP   21 mg at 03/24/22 0740  ? OLANZapine zydis (ZYPREXA) disintegrating tablet 10 mg  10 mg Oral QHS Karsten Ro, MD   10 mg at 03/23/22 2113  ? OLANZapine zydis (ZYPREXA) disintegrating tablet 5 mg  5 mg Oral Daily Karsten Ro, MD   5 mg at 03/24/22 0741  ? prazosin (MINIPRESS) capsule 1 mg  1 mg Oral QHS Eliseo Gum B, MD   1 mg at 03/23/22 2113  ? thiamine tablet 100 mg  100 mg Oral Daily Karsten Ro, MD   100 mg at 03/24/22 0741  ? traZODone (DESYREL) tablet 50 mg  50 mg Oral QHS PRN Dahlia Byes C, NP   50 mg at 03/23/22 2113  ? ? ?Lab Results: No results found for this or any previous visit (from the past 48 hour(s)). ? ?Blood Alcohol level:  ?Lab Results  ?Component Value Date  ? ETH <10 03/17/2022  ? ? ?Metabolic Disorder Labs: ?Lab Results  ?Component Value Date  ? HGBA1C 4.8 03/21/2022  ? MPG 91.06 03/21/2022  ? ?No results found for: PROLACTIN ?Lab Results  ?Component Value Date  ? CHOL 184 03/21/2022  ? TRIG 66 03/21/2022  ? HDL 55 03/21/2022  ? CHOLHDL 3.3 03/21/2022  ? VLDL 13 03/21/2022  ? LDLCALC 116 (H) 03/21/2022  ? ? ?Physical Findings: ?AIMS:  , ,  ,  ,    ?CIWA:    ?COWS:    ? ?Musculoskeletal: ?Strength & Muscle Tone: within normal limits ?Gait & Station: normal ?Patient leans: N/A ? ?Psychiatric Specialty Exam: ? ?Presentation  ?General Appearance: Appropriate for Environment; Casual ? ?Eye Contact:Fair ? ?Speech:Normal Rate ? ?Speech Volume:Normal ? ?Handedness:No data recorded ? ?Mood and Affect  ?Mood:Euthymic ? ?Affect:Constricted ? ? ?Thought Process  ?Thought Processes:Linear ? ?Descriptions of Associations:Intact ? ?Orientation:Full (Time, Place and Person) ? ?Thought  Content:Logical ? ?History of Schizophrenia/Schizoaffective disorder:Yes ? ?Duration of Psychotic Symptoms:Less than six months ? ?Hallucinations:Hallucinations: None ? ?Ideas of Reference:None ? ?Suicidal Thoughts:Suicidal Thoughts: No ? ?Homicidal Thoughts:Homicidal Thoughts: No ? ? ?Sensorium  ?Memory:Immediate Fair; Remote Fair; Recent Fair ? ?Judgment:Fair ? ?Insight:Fair ? ? ?Executive Functions  ?Concentration:Fair ? ?Attention Span:Fair ? ?Recall:Fair ? ?Fund of Knowledge:Fair ? ?Language:Fair ? ? ?Psychomotor Activity  ?Psychomotor Activity:Psychomotor Activity:  Normal ? ? ?Assets  ?Assets:Leisure Time; Desire for Improvement ? ? ?Sleep  ?Sleep:Sleep: Fair ?Number of Hours of Sleep: 6.75 ? ? ? ?Physical Exam: ?Physical Exam ?HENT:  ?   Head: Normocephalic and atraumatic.  ?Eyes:  ?   Extraocular Movements: Extraocular movements intact.  ?Pulmonary:  ?   Effort: Pulmonary effort is normal.  ?Musculoskeletal:  ?   Cervical back: Normal range of motion.  ?Neurological:  ?   Mental Status: He is alert and oriented to person, place, and time.  ?Psychiatric:     ?   Behavior: Behavior normal.     ?   Thought Content: Thought content normal.     ?   Judgment: Judgment normal.  ? ?Review of Systems  ?Constitutional:  Negative for fever.  ?HENT:  Negative for hearing loss.   ?Eyes:  Negative for blurred vision.  ?Gastrointestinal:  Negative for nausea and vomiting.  ?Musculoskeletal:  Negative for back pain and myalgias.  ?Neurological:  Negative for headaches.  ?Psychiatric/Behavioral:  Negative for hallucinations and suicidal ideas. The patient does not have insomnia.   ?Blood pressure 108/77, pulse 74, temperature 98.2 ?F (36.8 ?C), temperature source Oral, resp. rate 18, height 5' 8.5" (1.74 m), weight 77.9 kg, SpO2 97 %. Body mass index is 25.74 kg/m?. ? ? ?Treatment Plan Summary: ?Daily contact with patient to assess and evaluate symptoms and progress in treatment and Medication management ? ?Patient is very  guarded and is reluctant to share how he is really doing, but appears to be invested in trying to improve. Patient was able to disclose today that a lot of his psychosis is related to his previous traumas and he is also able to recoun

## 2022-03-25 NOTE — Progress Notes (Signed)
[facility] MD Progress Note ? ?03/25/2022 3:41 PM ?Rickey Miller  ?MRN:  [id number] ?Subjective:  Patient is a 31 yr old male with reported past psychiatric history significant for schizophrenia, Bipolar disorder and anxiety initially presented to [facility] on 4/15 with concern of overdose. He was assessed by psychiatry and was recommended for inpatient psychiatric admission.  Patient was admitted to [facility] adult unit on 03/20/2022. ? ?Patient seen this afternoon on rounds. He slept well. He has noticed poor motivation and fatigue during the day. He has appropriate questions about his current medications. He was previously on abilify and effexor. He has questions about his disease process and prospects of recovery long term. No hallucinations, thoughts of harm to self or others. Future planning is improving.  ? ?Staff note that patient is pleasant but guarded. Patient is medication compliant. He went to groups yesterday  ? ?Principal Problem: Schizoaffective disorder ([place]) ?Diagnosis: Principal Problem: ?  Schizoaffective disorder ([place]) ?Active Problems: ?  Polysubstance abuse ([place]) ?  Cocaine abuse ([place]) ?  Cannabis abuse ?  Social anxiety disorder ?  GAD (generalized anxiety disorder) ?  Nicotine dependence ?  Alcohol use disorder ? ?Total Time spent with patient: 15 minutes ? ?Past Psychiatric History: See H&P ? ?Past Medical History: History reviewed. No pertinent past medical history.  ?Past Surgical History:  ?Procedure Laterality Date  ? TONSILLECTOMY    ? ?Family History: History reviewed. No pertinent family history. ?Family Psychiatric  History: See H&P ?Social History:  ?Social History  ? ?Substance and Sexual Activity  ?Alcohol Use Yes  ? Comment: Friday, Sat  ?   ?Social History  ? ?Substance and Sexual Activity  ?Drug Use Yes  ? Types: Marijuana  ? Comment: weekly; unable to quantify  ?  ?Social History  ? ?Socioeconomic History  ? Marital status: Unknown  ?  Spouse name: Not on file  ? Number of children: Not on  file  ? Years of education: Not on file  ? Highest education level: Not on file  ?Occupational History  ? Not on file  ?Tobacco Use  ? Smoking status: Every Day  ?  Packs/day: 1.00  ?  Years: 13.00  ?  Pack years: 13.00  ?  Types: Cigarettes  ? Smokeless tobacco: Never  ?Vaping Use  ? Vaping Use: Former  ?Substance and Sexual Activity  ? Alcohol use: Yes  ?  Comment: Friday, Sat  ? Drug use: Yes  ?  Types: Marijuana  ?  Comment: weekly; unable to quantify  ? Sexual activity: Yes  ?  Birth control/protection: None  ?Other Topics Concern  ? Not on file  ?Social History Narrative  ? Not on file  ? ?Social Determinants of Health  ? ?Financial Resource Strain: Not on file  ?Food Insecurity: Not on file  ?Transportation Needs: Not on file  ?Physical Activity: Not on file  ?Stress: Not on file  ?Social Connections: Not on file  ? ?Additional Social History:  ?  ?  ?  ?  ?  ?  ?  ?  ?  ?  ?  ? ?Sleep: Fair ? ?Appetite:  Good ? ?Current Medications: ?Current Facility-Administered Medications  ?Medication Dose Route Frequency Provider Last Rate Last Admin  ? acetaminophen (TYLENOL) tablet 650 mg  650 mg Oral Q6H PRN Suella Broad, FNP   650 mg at 03/20/22 2033  ? alum & mag hydroxide-simeth (MAALOX/MYLANTA) 200-200-20 MG/5ML suspension 30 mL  30 mL Oral Q6H PRN Starkes-Perry, Gayland Curry,  FNP      ? diphenhydrAMINE (BENADRYL) capsule 50 mg  50 mg Oral Q6H PRN Burt Ek Gayland Curry, FNP      ? guanFACINE (INTUNIV) ER tablet 1 mg  1 mg Oral Daily Damita Dunnings B, MD   1 mg at 03/25/22 [id number]  ? hydrOXYzine (ATARAX) tablet 25 mg  25 mg Oral TID PRN Charmaine Downs C, NP   25 mg at 03/24/22 2101  ? LORazepam (ATIVAN) injection 1-2 mg  1-2 mg Intravenous Q4H PRN Burt Ek Gayland Curry, FNP      ? ziprasidone (GEODON) injection 20 mg  20 mg Intramuscular Q12H PRN Suella Broad, FNP      ? And  ? LORazepam (ATIVAN) tablet 1 mg  1 mg Oral PRN Burt Ek Gayland Curry, FNP      ? magnesium hydroxide (MILK OF MAGNESIA)  suspension 30 mL  30 mL Oral Daily PRN Starkes-Perry, Gayland Curry, FNP      ? nicotine (NICODERM CQ - dosed in mg/24 hours) patch 21 mg  21 mg Transdermal Daily Suella Broad, FNP   21 mg at 03/25/22 0747  ? OLANZapine zydis (ZYPREXA) disintegrating tablet 10 mg  10 mg Oral QHS Armando Reichert, MD   10 mg at 03/24/22 2101  ? OLANZapine zydis (ZYPREXA) disintegrating tablet 5 mg  5 mg Oral Daily Armando Reichert, MD   5 mg at 03/25/22 0748  ? prazosin (MINIPRESS) capsule 1 mg  1 mg Oral QHS Damita Dunnings B, MD   1 mg at 03/24/22 2101  ? thiamine tablet 100 mg  100 mg Oral Daily Armando Reichert, MD   100 mg at 03/25/22 0749  ? traZODone (DESYREL) tablet 50 mg  50 mg Oral QHS PRN Charmaine Downs C, NP   50 mg at 03/24/22 2101  ? ? ?Lab Results: No results found for this or any previous visit (from the past 48 hour(s)). ? ?Blood Alcohol level:  ?Lab Results  ?Component Value Date  ? ETH <10 03/17/2022  ? ? ?Metabolic Disorder Labs: ?Lab Results  ?Component Value Date  ? HGBA1C 4.8 03/21/2022  ? MPG 91.06 03/21/2022  ? ?No results found for: PROLACTIN ?Lab Results  ?Component Value Date  ? CHOL 184 03/21/2022  ? TRIG 66 03/21/2022  ? HDL 55 03/21/2022  ? CHOLHDL 3.3 03/21/2022  ? VLDL 13 03/21/2022  ? LDLCALC 116 (H) 03/21/2022  ? ? ?Physical Findings: ?AIMS:  , ,  ,  ,    ?CIWA:    ?COWS:    ? ?Musculoskeletal: ?Strength & Muscle Tone: within normal limits ?Gait & Station: normal ?Patient leans: N/A ? ?Psychiatric Specialty Exam: ? ?Presentation  ?General Appearance: Appropriate for Environment; Casual ? ?Eye Contact:Fair ? ?Speech:Normal Rate ? ?Speech Volume:Normal ? ?Handedness:No data recorded ? ?Mood and Affect  ?Mood:Euthymic ? ?Affect:Constricted ? ? ?Thought Process  ?Thought Processes:Linear ? ?Descriptions of Associations:Intact ? ?Orientation:Full (Time, Place and Person) ? ?Thought Content:Logical ? ?History of Schizophrenia/Schizoaffective disorder:Yes ? ?Duration of Psychotic Symptoms:Less than six  months ? ?Hallucinations:Hallucinations: None ? ?Ideas of Reference:None ? ?Suicidal Thoughts:Suicidal Thoughts: No ? ?Homicidal Thoughts:Homicidal Thoughts: No ? ? ?Sensorium  ?Memory:Immediate Fair; Remote Fair; Recent Fair ? ?Judgment:Fair ? ?Insight:Fair ? ? ?Executive Functions  ?Concentration:Fair ? ?Attention Span:Fair ? ?Recall:Fair ? ?[place] ? ?Language:Fair ? ? ?Psychomotor Activity  ?Psychomotor Activity:Psychomotor Activity: Normal ? ? ?Assets  ?Assets:Leisure Time; Desire for Improvement ? ? ?Sleep  ?Sleep:Sleep: Fair ? ? ? ?Physical Exam: ?Physical Exam ?HENT:  ?  Head: Normocephalic and atraumatic.  ?Eyes:  ?   Extraocular Movements: Extraocular movements intact.  ?Pulmonary:  ?   Effort: Pulmonary effort is normal.  ?Musculoskeletal:  ?   Cervical back: Normal range of motion.  ?Neurological:  ?   Mental Status: He is alert and oriented to person, place, and time.  ?Psychiatric:     ?   Behavior: Behavior normal.     ?   Thought Content: Thought content normal.     ?   Judgment: Judgment normal.  ? ?Review of Systems  ?Constitutional:  Positive for malaise/fatigue. Negative for fever.  ?HENT:  Negative for hearing loss.   ?Eyes:  Negative for blurred vision.  ?Gastrointestinal:  Negative for nausea and vomiting.  ?Musculoskeletal:  Negative for back pain and myalgias.  ?Neurological:  Positive for speech change. Negative for headaches.  ?Psychiatric/Behavioral:  Negative for hallucinations and suicidal ideas. The patient does not have insomnia.   ?Blood pressure 109/75, pulse 65, temperature 97.8 ?F (36.6 ?C), resp. rate 16, height 5' 8.5" (1.74 m), weight 77.9 kg, SpO2 100 %. Body mass index is 25.74 kg/m?. ? ? ?Treatment Plan Summary: ?Daily contact with patient to assess and evaluate symptoms and progress in treatment and Medication management ? ?Patient is more engaged in his treatment with regards to learning about his medications and illness. He is participating in his  discharge planning. He is responding to medications so far, though there is still some issues with motivation and energy. He continues to struggle with mood and irritability, but has made some progress.  ? ?DDX ?[place]

## 2022-03-25 NOTE — BHH Group Notes (Signed)
Adult Psychoeducational Group  ?Date:  03/06/15/2023 ?Time:  1300-1400 ? ?Group Topic/Focus: Continuation of the group from Saturday. Looking at the lists that were created and talking about what needs to be done with the homework of 30 positives about themselves.  ?                                   Talking about taking their power back and helping themselves to develop a positive self esteem. ?     ?Participation Quality:  Appropriate ? ?Affect:  Appropriate ? ?Cognitive:  Oriented ? ?Insight: Improving ? ?Engagement in Group:  Engaged ? ?Modes of Intervention:  Activity, Discussion, Education, and Support ? ?Additional Comments:  Pt was quite sleepy and for the first 20-30 minutes of the group kept his eyes closed. Rates his energy at a 6/10. Towards the end of the group, Pt participated fully in the group. ? ?[place] A ? ? ?

## 2022-03-25 NOTE — BHH Group Notes (Signed)
Adult Psychoeducational Group Not ?Date:  03/25/2022 ?Time:  [id number] ?Group Topic/Focus: PROGRESSIVE RELAXATION. A group where deep breathing is taught and tensing and relaxation muscle groups is used. Imagery is used as well.  Pts are asked to imagine 3 pillars that hold them up when they are not able to hold themselves up and to share that with the group. ? ?Participation Level:  Active ? ?Participation Quality:  Appropriate ? ?Affect:  Appropriate ? ?Cognitive:  Oriented ? ?Insight: Improving ? ?Engagement in Group:  Engaged ? ?Modes of Intervention:  Activity, Discussion, Education, and Support ? ?Additional Comments:  RAtes his energy at a 7/10. States music holds him up. ? ?Bryson Dames A ? ?

## 2022-03-25 NOTE — Progress Notes (Signed)
?   03/24/22 2000  ?Psych Admission Type (Psych Patients Only)  ?Admission Status Voluntary  ?Psychosocial Assessment  ?Patient Complaints Isolation  ?Eye Contact Fair  ?Facial Expression Sad  ?Affect Blunted  ?Speech Logical/coherent;Soft  ?Interaction Minimal;Isolative;Cautious  ?Motor Activity Other (Comment) ?(WDL)  ?Appearance/Hygiene Disheveled  ?Behavior Characteristics Appropriate to situation;Cooperative  ?Mood Apprehensive  ?Thought Process  ?Coherency Circumstantial  ?Content WDL  ?Delusions Paranoid  ?Perception WDL  ?Hallucination None reported or observed  ?Judgment Impaired  ?Confusion None  ?Danger to Self  ?Current suicidal ideation? Denies  ?Danger to Others  ?Danger to Others None reported or observed  ? ? ?

## 2022-03-25 NOTE — Group Note (Signed)
[place] LCSW Group Therapy Note ? ?03/25/2022   ? ?Type of Therapy and Topic:  Group Therapy:  Adding Supports Including Yourself ? ?Participation Level:  Active  ? ?Description of Group:   Patients in this group were introduced to the concept that additional supports including self-support are an essential part of recovery.  Patients listed their current healthy and unhealthy supports, and discussed the difference between the two.   Several songs were played and a group discussion ensued in which patients stated they could relate to the songs which inspired them to realize they have be willing to help themselves in order to succeed, because other people cannot achieve sobriety or stability for them.  Parents were encouraged toward self-advocacy and self-support as part of their recovery.  They discussed their reactions to these songs' messages, which were positive and hopeful.  Before group ended, they identified the supports they believe they need to add to their lives to achieve their goals at discharge.  ? ?Therapeutic Goals: ?1)  explain the difference between healthy and unhealthy supports and discuss what specific supports are currently in patients' lives ?2)  demonstrate the importance of being a key part of one's own support system ?3)  discuss the need for appropriate boundaries with supports ?4)  elicit ideas from patients about supports that need to be added in order to achieve goals ?  ?Summary of Patient Progress:   The patient listed current healthy supports as family and friends and current unhealthy supports as alcohol.  He listened but was obviously sleepy and did not talk during group. ? ?Therapeutic Modalities:   ?Motivational Interviewing ?Activity ? ?[name]  ? ?   ?

## 2022-03-25 NOTE — Progress Notes (Signed)
D. Pt continues to present as sad and somewhat guarded- minimal during interactions, but has been visible in the milieu attending groups. Pt rated his depression, hopelessness and anxiety all 6's today, and denied pain, SI/HI and A/VH. Pt reported that his goal was to work on staying positive by staying focused.  contact staff before acting on any harmful thoughts.  ?A. Labs and vitals monitored. Pt given and educated on medications. Pt supported emotionally and encouraged to express concerns and ask questions.   ?R. Pt remains safe with 15 minute checks. Will continue POC. ? ?  ?

## 2022-03-26 ENCOUNTER — Encounter (HOSPITAL_COMMUNITY): Payer: Self-pay

## 2022-03-26 DIAGNOSIS — F209 Schizophrenia, unspecified: Secondary | ICD-10-CM

## 2022-03-26 MED ORDER — TRAZODONE HCL 50 MG PO TABS: 50.0000 mg | ORAL_TABLET | Freq: Every evening | ORAL | 0 refills | Status: DC | PRN

## 2022-03-26 MED ORDER — GUANFACINE HCL ER 1 MG PO TB24: 1.0000 mg | ORAL_TABLET | Freq: Every day | ORAL | 0 refills | Status: DC

## 2022-03-26 MED ORDER — OLANZAPINE 10 MG PO TABS: 10.0000 mg | ORAL_TABLET | Freq: Every day | ORAL | 0 refills | Status: DC

## 2022-03-26 MED ORDER — NICOTINE POLACRILEX 2 MG MT GUM
2.0000 mg | CHEWING_GUM | OROMUCOSAL | 0 refills | Status: DC | PRN
Start: 2022-03-26 — End: 2022-04-13

## 2022-03-26 MED ORDER — THIAMINE HCL 100 MG PO TABS: 100.0000 mg | ORAL_TABLET | Freq: Every day | ORAL | 0 refills | Status: DC

## 2022-03-26 MED ORDER — NICOTINE POLACRILEX 2 MG MT GUM
CHEWING_GUM | OROMUCOSAL | Status: AC
  Filled 2022-03-26: qty 1

## 2022-03-26 MED ORDER — OLANZAPINE 5 MG PO TABS: 5.0000 mg | ORAL_TABLET | Freq: Every day | ORAL | 0 refills | Status: DC

## 2022-03-26 MED ORDER — HYDROXYZINE HCL 25 MG PO TABS: 25.0000 mg | ORAL_TABLET | Freq: Three times a day (TID) | ORAL | 0 refills | Status: AC | PRN

## 2022-03-26 MED ORDER — NICOTINE POLACRILEX 2 MG MT GUM
2.0000 mg | CHEWING_GUM | OROMUCOSAL | Status: DC | PRN
  Administered 2022-03-26 (×2): 2 mg via ORAL
  Filled 2022-03-26: qty 1

## 2022-03-26 MED ORDER — PRAZOSIN HCL 1 MG PO CAPS: 1.0000 mg | ORAL_CAPSULE | Freq: Every day | ORAL | 0 refills | Status: DC
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
## 2022-03-26 NOTE — Progress Notes (Signed)
?   03/25/22 2200  ?Psych Admission Type (Psych Patients Only)  ?Admission Status Voluntary  ?Psychosocial Assessment  ?Patient Complaints Other (Comment);Anxiety  ?Eye Contact Fair  ?Facial Expression Sad  ?Affect Sad  ?Speech Logical/coherent  ?Interaction Guarded  ?Motor Activity Other (Comment)  ?Appearance/Hygiene Unremarkable  ?Behavior Characteristics Cooperative;Appropriate to situation  ?Mood Anxious  ?Thought Process  ?Coherency Circumstantial  ?Content WDL  ?Delusions None reported or observed  ?Perception WDL  ?Hallucination None reported or observed  ?Judgment Impaired  ?Confusion None  ?Danger to Self  ?Current suicidal ideation? Denies  ?Description of Suicide Plan NONE  ?Agreement Not to Harm Self Yes ?(verbal)  ?Description of Agreement verbal  ?Danger to Others  ?Danger to Others None reported or observed  ? ? ?

## 2022-03-26 NOTE — BHH Group Notes (Signed)
Spiritual care group on grief and loss facilitated by chaplain Janne Napoleon, [facility]  ? ?Group Goal:  ? ?Support / Education around grief and loss  ? ?Members engage in facilitated group support and psycho-social education.  ? ?Group Description:  ? ?Following introductions and group rules, group members engaged in facilitated group dialog and support around topic of loss, with particular support around experiences of loss in their lives. Group Identified types of loss (relationships / self / things) and identified patterns, circumstances, and changes that precipitate losses. Reflected on thoughts / feelings around loss, normalized grief responses, and recognized variety in grief experience. Group noted Worden's four tasks of grief in discussion.  ? ?Group drew on Adlerian / Rogerian, narrative, MI,  ? ?Patient Progress: Seeley attended group and was actively engaged in the conversation.  He shared about his coping strategies.  His comments showed insight and he was respectful of peers. ? ?[employer], [facility] ?Pager, [phone] ?

## 2022-03-26 NOTE — Progress Notes (Signed)
?  [facility] Adult Case Management Discharge Plan : ? ?Will you be returning to the same living situation after discharge:  Yes,  home ?At discharge, do you have transportation home?: Yes,  bus tickets to get to motel ?Do you have the ability to pay for your medications: Yes,  provided resources for discounted prescriptions ? ?Release of information consent forms completed and in the chart;  Patient's signature needed at discharge. ? ?Patient to Follow up at: ? Follow-up Information   ? ? [facility]. Go to.   ?Specialty: Behavioral Health ?Why: Please go to this provider for therapy and medication management services on walk in days:  Monday and Wednesday, arrive at 7:30 am.  Services are provided on a first come, first served basis. ?Contact information: ?[address] ?[place] [address] ?[phone] ? ?  ?  ? ?  ?  ? ?  ? ? ?Next level of care provider has access to [facility] Link:yes ? ?Safety Planning and Suicide Prevention discussed: Yes,  friend, [name] ? ?  ? ?Has patient been referred to the Quitline?: Patient refused referral ? ?Patient has been referred for addiction treatment: N/A ? ?Rickey Miller Rickey Beryl Hornberger, LCSW ?03/26/2022, 2:00 PM ?

## 2022-03-26 NOTE — Discharge Summary (Signed)
Physician Discharge Summary Note ? ?Patient:  Rickey Miller is an 31 y.o., male ?MRN:  [id number] ?DOB:  Jun 19, 1991 ?Patient phone:  [phone] (home)  ?Patient address:   ?Homeless ?[place],  ?Total Time spent with patient: 30 minutes ? ?Date of Admission:  03/20/2022 ?Date of Discharge: 03/26/22 ? ?Reason for Admission:   Patient is a 31 yr old male with reported past psychiatric history significant for schizophrenia, Bipolar disorder and anxiety initially presented to [facility] on 4/15 with concern of overdose. He was assessed by psychiatry and was recommended for inpatient psychiatric admission.  Patient was admitted to [facility] adult unit on 03/20/2022. ? ?Principal Problem: Schizoaffective disorder (HCC) ?Discharge Diagnoses: Principal Problem: ?  Schizoaffective disorder (HCC) ?Active Problems: ?  Polysubstance abuse (HCC) ?  Cocaine abuse (HCC) ?  Cannabis abuse ?  Social anxiety disorder ?  GAD (generalized anxiety disorder) ?  Nicotine dependence ?  Alcohol use disorder ? ? ?Past Psychiatric History: see H&P ? ?Past Medical History: History reviewed. No pertinent past medical history.  ?Past Surgical History:  ?Procedure Laterality Date  ? TONSILLECTOMY    ? ?Family History: History reviewed. No pertinent family history. ?Family Psychiatric  History: see H&P ?Social History:  ?Social History  ? ?Substance and Sexual Activity  ?Alcohol Use Yes  ? Comment: Friday, Sat  ?   ?Social History  ? ?Substance and Sexual Activity  ?Drug Use Yes  ? Types: Marijuana  ? Comment: weekly; unable to quantify  ?  ?Social History  ? ?Socioeconomic History  ? Marital status: Unknown  ?  Spouse name: Not on file  ? Number of children: Not on file  ? Years of education: Not on file  ? Highest education level: Not on file  ?Occupational History  ? Not on file  ?Tobacco Use  ? Smoking status: Every Day  ?  Packs/day: 1.00  ?  Years: 13.00  ?  Pack years: 13.00  ?  Types: Cigarettes  ? Smokeless tobacco: Never  ?Vaping Use  ?  Vaping Use: Former  ?Substance and Sexual Activity  ? Alcohol use: Yes  ?  Comment: Friday, Sat  ? Drug use: Yes  ?  Types: Marijuana  ?  Comment: weekly; unable to quantify  ? Sexual activity: Yes  ?  Birth control/protection: None  ?Other Topics Concern  ? Not on file  ?Social History Narrative  ? Not on file  ? ?Social Determinants of Health  ? ?Financial Resource Strain: Not on file  ?Food Insecurity: Not on file  ?Transportation Needs: Not on file  ?Physical Activity: Not on file  ?Stress: Not on file  ?Social Connections: Not on file  ? ? ?Hospital Course:  HOSPITAL COURSE: ?  ?During the patient's hospitalization, patient had extensive initial psychiatric evaluation, and follow-up psychiatric evaluations every day. ?  ?Psychiatric diagnoses provided upon initial assessment:  ?Schizoaffective disorder (HCC) ?Active Problems: ?  Polysubstance abuse (HCC) ?  Cocaine abuse (HCC) ?  Cannabis abuse ?  Social anxiety disorder ?  GAD (generalized anxiety disorder) ?  Nicotine dependence ? Alcohol use disorder ?Patient's psychiatric medications were adjusted on admission:  ?-Increased Zyprexa to 5 mg Morning and 10 mg Nightly. ?-Continued Agitation protocol ?-Continue Ativan 1-2 mg Q4H PRN for anxiety ? Started Thiamine 100 mg Daily. ? Continued Nicotine patch daily ?During the hospitalization, other adjustments were made to the patient's psychiatric medication regimen:  ?Zyprexa titrated to 5 mg in the morning and 10 mg at night ?Started on  prazosin 1 mg p.o. nightly for nightmares ?Started on Intuniv 1 mg p.o. daily for irritability ?  ?Patient's care was discussed during the interdisciplinary team meeting every day during the hospitalization. ?  ?The patient denies having side effects to prescribed psychiatric medication. ?  ?Gradually, patient started adjusting to milieu. The patient was evaluated each day by a clinical provider to ascertain response to treatment. Improvement was noted by the patient's report of  decreasing symptoms, improved sleep and appetite, affect, medication tolerance, behavior, and participation in unit programming.  Patient was asked each day to complete a self inventory noting mood, mental status, pain, new symptoms, anxiety and concerns.   ?Symptoms were reported as significantly decreased or resolved completely by discharge.  ?The patient reports that their mood is stable.  ?The patient denied having suicidal thoughts for more than 48 hours prior to discharge.  Patient denies having homicidal thoughts.  Patient denies having auditory hallucinations.  Patient denies any visual hallucinations or other symptoms of psychosis.  ?The patient was motivated to continue taking medication with a goal of continued improvement in mental health.  ?  ?The patient reports their target psychiatric symptoms of schizoaffective disorder responded well to the psychiatric medications, and the patient reports overall benefit other psychiatric hospitalization. Supportive psychotherapy was provided to the patient. The patient also participated in regular group therapy while hospitalized. Coping skills, problem solving as well as relaxation therapies were also part of the unit programming. ?  ?Labs were reviewed with the patient, and abnormal results were discussed with the patient. ?  ?The patient is able to verbalize their individual safety plan to this provider. ?  ?# It is recommended to the patient to continue psychiatric medications as prescribed, after discharge from the hospital.   ?  ?# It is recommended to the patient to follow up with your outpatient psychiatric provider and PCP. ?  ?# It was discussed with the patient, the impact of alcohol, drugs, tobacco have been there overall psychiatric and medical wellbeing, and total abstinence from substance use was recommended the patient.ed. ?  ?# Prescriptions provided or sent directly to preferred pharmacy at discharge. Patient agreeable to plan. Given opportunity  to ask questions. Appears to feel comfortable with discharge.  ?  ?# In the event of worsening symptoms, the patient is instructed to call the crisis hotline, 911 and or go to the nearest ED for appropriate evaluation and treatment of symptoms. To follow-up with primary care provider for other medical issues, concerns and or health care needs ?  ?#Patient seen by this MD. At time of discharge, consistently refuted any suicidal ideation, intention or plan, denies any Self harm urges. Denies any A/VH and no delusions were elicited and does not seem to be responding to internal stimuli. During assessment the patient is able to verbalize appropriated coping skills and safety plan to use on return home. Patient verbalizes intent to be compliant with medication and outpatient services.  ?  ?# Patient was discharged to his motel with a plan to follow up as noted below ? ?Physical Findings: ?AIMS: Facial and Oral Movements ?Muscles of Facial Expression: None, normal ?Lips and Perioral Area: None, normal ?Jaw: None, normal ?Tongue: None, normal,Extremity Movements ?Upper (arms, wrists, hands, fingers): None, normal ?Lower (legs, knees, ankles, toes): None, normal, Trunk Movements ?Neck, shoulders, hips: None, normal, Overall Severity ?Severity of abnormal movements (highest score from questions above): None, normal ?Incapacitation due to abnormal movements: None, normal ?Patient's awareness of abnormal movements (rate  only patient's report): No Awareness, Dental Status ?Current problems with teeth and/or dentures?: No ?Does patient usually wear dentures?: No  ?CIWA:    ?COWS:    ? ?Musculoskeletal: ?Strength & Muscle Tone: within normal limits ?Gait & Station: normal ?Patient leans: N/A ? ? ?Psychiatric Specialty Exam: ? ?Presentation  ?General Appearance: Appropriate for Environment; Casual ? ?Eye Contact:Fair ? ?Speech:Clear and Coherent; Normal Rate ? ?Speech Volume:Normal ? ?Handedness:No data recorded ? ?Mood and Affect   ?Mood:Euthymic ? ?Affect:Congruent ? ? ?Thought Process  ?Thought Processes:Linear ? ?Descriptions of Associations:Intact ? ?Orientation:Full (Time, Place and Person) ? ?Thought Content:Logical ? ?History

## 2022-03-26 NOTE — Group Note (Signed)
Date:  03/26/2022 ?Time:  11:55 AM ? ?Group Topic/Focus:  ?Orientation:   The focus of this group is to educate the patient on the purpose and policies of crisis stabilization and provide a format to answer questions about their admission.  The group details unit policies and expectations of patients while admitted. ? ? ? ?Participation Level:  Active ? ?Participation Quality:  Appropriate ? ?Affect:  Appropriate ? ?Cognitive:  Appropriate ? ?Insight: Appropriate ? ?Engagement in Group:  Engaged ? ?Modes of Intervention:  Discussion ? ?Additional Comments:   ? ?Rhyann Berton Lashawn Amaurie Schreckengost ?03/26/2022, 11:55 AM ? ?

## 2022-03-26 NOTE — BH IP Treatment Plan (Signed)
Interdisciplinary Treatment and Diagnostic Plan Update ? ?03/26/2022 ?Time of Session: 10:40am ?Rickey Miller ?MRN: [id number] ? ?Principal Diagnosis: Schizoaffective disorder (HCC) ? ?Secondary Diagnoses: Principal Problem: ?  Schizoaffective disorder (HCC) ?Active Problems: ?  Polysubstance abuse (HCC) ?  Cocaine abuse (HCC) ?  Cannabis abuse ?  Social anxiety disorder ?  GAD (generalized anxiety disorder) ?  Nicotine dependence ?  Alcohol use disorder ? ? ?Current Medications:  ?Current Facility-Administered Medications  ?Medication Dose Route Frequency Provider Last Rate Last Admin  ? acetaminophen (TYLENOL) tablet 650 mg  650 mg Oral Q6H PRN Maryagnes Amos, FNP   650 mg at 03/20/22 2033  ? alum & mag hydroxide-simeth (MAALOX/MYLANTA) 200-200-20 MG/5ML suspension 30 mL  30 mL Oral Q6H PRN Rosario Adie, Juel Burrow, FNP      ? diphenhydrAMINE (BENADRYL) capsule 50 mg  50 mg Oral Q6H PRN Maryagnes Amos, FNP      ? guanFACINE (INTUNIV) ER tablet 1 mg  1 mg Oral Daily Eliseo Gum B, MD   1 mg at 03/26/22 9379  ? hydrOXYzine (ATARAX) tablet 25 mg  25 mg Oral TID PRN Dahlia Byes C, NP   25 mg at 03/25/22 2108  ? LORazepam (ATIVAN) injection 1-2 mg  1-2 mg Intravenous Q4H PRN Rosario Adie Juel Burrow, FNP      ? ziprasidone (GEODON) injection 20 mg  20 mg Intramuscular Q12H PRN Maryagnes Amos, FNP      ? And  ? LORazepam (ATIVAN) tablet 1 mg  1 mg Oral PRN Rosario Adie Juel Burrow, FNP      ? magnesium hydroxide (MILK OF MAGNESIA) suspension 30 mL  30 mL Oral Daily PRN Maryagnes Amos, FNP      ? nicotine polacrilex (NICORETTE) gum 2 mg  2 mg Oral PRN Roselle Locus, MD   2 mg at 03/26/22 0754  ? OLANZapine zydis (ZYPREXA) disintegrating tablet 10 mg  10 mg Oral QHS Karsten Ro, MD   10 mg at 03/25/22 2109  ? OLANZapine zydis (ZYPREXA) disintegrating tablet 5 mg  5 mg Oral Daily Karsten Ro, MD   5 mg at 03/26/22 0240  ? prazosin (MINIPRESS) capsule 1 mg  1 mg Oral QHS  Eliseo Gum B, MD   1 mg at 03/25/22 2108  ? thiamine tablet 100 mg  100 mg Oral Daily Karsten Ro, MD   100 mg at 03/26/22 9735  ? traZODone (DESYREL) tablet 50 mg  50 mg Oral QHS PRN Dahlia Byes C, NP   50 mg at 03/25/22 2108  ? ?PTA Medications: ?Medications Prior to Admission  ?Medication Sig Dispense Refill Last Dose  ? acetaminophen (TYLENOL) 325 MG tablet Take 2 tablets (650 mg total) by mouth every 6 (six) hours as needed for mild pain, fever or headache.     ? OLANZapine zydis (ZYPREXA) 5 MG disintegrating tablet Take 1 tablet (5 mg total) by mouth 2 (two) times daily.     ? ? ?Patient Stressors:   ? ?Patient Strengths:   ? ?Treatment Modalities: Medication Management, Group therapy, Case management,  ?1 to 1 session with clinician, Psychoeducation, Recreational therapy. ? ? ?Physician Treatment Plan for Primary Diagnosis: Schizoaffective disorder (HCC) ?Long Term Goal(s): Improvement in symptoms so as ready for discharge  ? ?Short Term Goals: Ability to identify changes in lifestyle to reduce recurrence of condition will improve ?Ability to verbalize feelings will improve ?Ability to disclose and discuss suicidal ideas ?Ability to demonstrate self-control will improve ?Ability to identify and develop effective  coping behaviors will improve ?Ability to maintain clinical measurements within normal limits will improve ?Compliance with prescribed medications will improve ?Ability to identify triggers associated with substance abuse/mental health issues will improve ? ?Medication Management: Evaluate patient's response, side effects, and tolerance of medication regimen. ? ?Therapeutic Interventions: 1 to 1 sessions, Unit Group sessions and Medication administration. ? ?Evaluation of Outcomes: Progressing ? ?Physician Treatment Plan for Secondary Diagnosis: Principal Problem: ?  Schizoaffective disorder (HCC) ?Active Problems: ?  Polysubstance abuse (HCC) ?  Cocaine abuse (HCC) ?  Cannabis abuse ?   Social anxiety disorder ?  GAD (generalized anxiety disorder) ?  Nicotine dependence ?  Alcohol use disorder ? ?Long Term Goal(s): Improvement in symptoms so as ready for discharge  ? ?Short Term Goals: Ability to identify changes in lifestyle to reduce recurrence of condition will improve ?Ability to verbalize feelings will improve ?Ability to disclose and discuss suicidal ideas ?Ability to demonstrate self-control will improve ?Ability to identify and develop effective coping behaviors will improve ?Ability to maintain clinical measurements within normal limits will improve ?Compliance with prescribed medications will improve ?Ability to identify triggers associated with substance abuse/mental health issues will improve    ? ?Medication Management: Evaluate patient's response, side effects, and tolerance of medication regimen. ? ?Therapeutic Interventions: 1 to 1 sessions, Unit Group sessions and Medication administration. ? ?Evaluation of Outcomes: Progressing ? ? ?RN Treatment Plan for Primary Diagnosis: Schizoaffective disorder (HCC) ?Long Term Goal(s): Knowledge of disease and therapeutic regimen to maintain health will improve ? ?Short Term Goals: Ability to remain free from injury will improve, Ability to verbalize frustration and anger appropriately will improve, Ability to demonstrate self-control, Ability to participate in decision making will improve, and Ability to verbalize feelings will improve ? ?Medication Management: RN will administer medications as ordered by provider, will assess and evaluate patient's response and provide education to patient for prescribed medication. RN will report any adverse and/or side effects to prescribing provider. ? ?Therapeutic Interventions: 1 on 1 counseling sessions, Psychoeducation, Medication administration, Evaluate responses to treatment, Monitor vital signs and CBGs as ordered, Perform/monitor CIWA, COWS, AIMS and Fall Risk screenings as ordered, Perform wound  care treatments as ordered. ? ?Evaluation of Outcomes: Progressing ? ? ?LCSW Treatment Plan for Primary Diagnosis: Schizoaffective disorder (HCC) ?Long Term Goal(s): Safe transition to appropriate next level of care at discharge, Engage patient in therapeutic group addressing interpersonal concerns. ? ?Short Term Goals: Engage patient in aftercare planning with referrals and resources, Increase social support, Increase ability to appropriately verbalize feelings, and Increase emotional regulation ? ?Therapeutic Interventions: Assess for all discharge needs, 1 to 1 time with Child psychotherapistocial worker, Explore available resources and support systems, Assess for adequacy in community support network, Educate family and significant other(s) on suicide prevention, Complete Psychosocial Assessment, Interpersonal group therapy. ? ?Evaluation of Outcomes: Progressing ? ? ?Progress in Treatment: ?Attending groups: Yes. ?Participating in groups: Yes. ?Taking medication as prescribed: Yes. ?Toleration medication: Yes. ?Family/Significant other contact made: Yes, individual(s) contacted:  friend ?Patient understands diagnosis: Yes. and No. ?Discussing patient identified problems/goals with staff: Yes. ?Medical problems stabilized or resolved: Yes. ?Denies suicidal/homicidal ideation: Yes. ?Issues/concerns per patient self-inventory: No. ? ? ?New problem(s) identified: No, Describe:  none ? ?New Short Term/Long Term Goal(s): medication stabilization, elimination of SI thoughts, development of comprehensive mental wellness plan.  ? ? ?Patient Goals:  "To get the right meds" ? ?Discharge Plan or Barriers: Pt [facility] to [facility] and [facility] and medication management ? ?Reason for Continuation of Hospitalization: Delusions  ?Medication stabilization ? ?Estimated Length of Stay: 1-3 days ? ?Last 3 [place] Suicide Severity Risk Score: ?Flowsheet Row [facility] (Current[facility]-Admission (Discharged) from 03/17/2022 in [facility]  ?C-SSRS RISK CATEGORY No Risk No Risk  ? ?  ? ? ?Last PHQ 2/9 Scores: ?   ? View : No data to

## 2022-03-26 NOTE — Group Note (Signed)
LCSW Group Therapy Note ? ? ?Group Date: 03/26/2022 ?Start Time: 1300 ?End Time: 1400 ? ?Type of Therapy and Topic:  Group Therapy: Understanding the differences between fear and anxiety / Fear Ladder & Examining the Evidence ? ?Participation Level:  Did not attend  ? ? ?Description of Group:   ?In this group session, patients learned how to define and recognize the similarities differences between fear and anxiety. Patients will explore reactions we have to fear and anxiety. Patients identified a fear or something they feel anxious about. Patients analyzed scenarios and discussed both the positive and negative aspects to them. Patients were asked to identify if it is a fear or anxiety as well.  Patients were asked to provide their own examples. Patients will learn what to do for both fear and anxiety. CSW provided tools such as breaking things up into smaller steps, challenging automatic negative thinking / questions to ask, fear ladder and how to use gradual exposure. Patients will be asked to practice each tool with situations and to complete a fear ladder for their personal gain.  ? ?Therapeutic Goals: ?Patients will learn the difference between fear versus anxiety and the definitions of both. ?Patients will utilize scenarios and be able to identify if this is an example of a fear or anxiety. ?Patients will learn tools to handle both their fears and anxieties as well as discuss breaking it into smaller steps and exposure.  ?Patients will be asked to provide examples of their own and discuss how things have both positive and negative aspects.  ?Patients were provided questions to ask to challenge automatic negative thinking for anxiety.  ?Patients were provided a sample form of a fear ladder and asked to complete one for a fear.  ? ?Summary of Patient Progress:  Did not attend  ? ?Therapeutic Modalities:   ?Cognitive Behavioral Therapy ?Motivational Interviewing  ?Brief Therapy ? ?Aram Beecham, LCSWA ?03/26/2022   1:57 PM   ? ?

## 2022-03-26 NOTE — Progress Notes (Signed)
Discharge note: RN met with pt and reviewed pt's discharge instructions. Pt verbalized understanding of discharge instructions and pt did not have any questions. RN reviewed and provided pt with a copy of SRA, AVS and Transition Record. RN returned pt's belongings to pt.  Prescriptions and samples were given to pt. Pt denied SI/HI/AVH and voiced no concerns. Pt was appreciative of the care pt received at [facility]. Patient discharged to the lobby without incident.  ? 03/26/22 0754  ?Psych Admission Type (Psych Patients Only)  ?Admission Status Voluntary  ?Psychosocial Assessment  ?Patient Complaints Other (Comment) ?(AH)  ?Eye Contact Fair  ?Facial Expression Flat;Sad  ?Affect Sad;Flat  ?Speech Soft  ?Interaction Guarded;Minimal  ?Motor Activity Pacing  ?Appearance/Hygiene Unremarkable  ?Behavior Characteristics Cooperative;Anxious;Pacing  ?Mood Anxious;Sad  ?Thought Process  ?Coherency Circumstantial  ?Content WDL  ?Delusions None reported or observed  ?Perception WDL  ?Hallucination Auditory  ?Judgment Impaired  ?Confusion None  ?Danger to Self  ?Current suicidal ideation? Denies  ?Danger to Others  ?Danger to Others None reported or observed  ?Danger to Others Abnormal  ?Harmful Behavior to others No threats or harm toward other people  ?Destructive Behavior No threats or harm toward property  ? ? ?

## 2022-03-26 NOTE — Group Note (Signed)
Recreation Therapy Group Note ? ? ?Group Topic:Stress Management  ?Group Date: 03/26/2022 ?Start Time: 0930 ?End Time: 0950 ?Facilitators: Victorino Sparrow, LRT,CTRS ?Location: [place] ? ? ?Goal Area(s) Addresses:  ?Patient will actively participate in stress management techniques presented during session.  ?Patient will successfully identify benefit of practicing stress management post d/c.  ? ?Group Description: Guided Imagery. LRT provided education, instruction, and demonstration on practice of visualization via guided imagery. Patient was asked to participate in the technique introduced during session. LRT debriefed including topics of mindfulness, stress management and specific scenarios each patient could use these techniques. Patients were given suggestions of ways to access scripts post d/c and encouraged to explore Youtube and other apps available on smartphones, tablets, and computers. ? ? ?Affect/Mood: Appropriate ?  ?Participation Level: Active ?  ?Participation Quality: Independent ?  ?Behavior: Appropriate ?  ?Speech/Thought Process: Focused ?  ?Insight: Good ?  ?Judgement: Good ?  ?Modes of Intervention: Script, Petra Kuba Sounds ?  ?Patient Response to Interventions:  Engaged ?  ?Education Outcome: ? Acknowledges education and In group clarification offered   ? ?Clinical Observations/Individualized Feedback: Pt attended and participated in group session.  ?  ? ?Plan: Continue to engage patient in RT group sessions 2-3x/week. ? ? ?Victorino Sparrow, LRT,CTRS ?03/26/2022 11:38 AM ?

## 2022-04-04 ENCOUNTER — Emergency Department (HOSPITAL_COMMUNITY)
Admission: EM | Admit: 2022-04-04 | Discharge: 2022-04-05 | Disposition: A | Discharge status: Other Acute Inpatient Hospital | Payer: No Typology Code available for payment source | Attending: Emergency Medicine | Admitting: Emergency Medicine

## 2022-04-04 DIAGNOSIS — F15188 Other stimulant abuse with other stimulant-induced disorder: Secondary | ICD-10-CM | POA: Insufficient documentation

## 2022-04-04 DIAGNOSIS — I808 Phlebitis and thrombophlebitis of other sites: Secondary | ICD-10-CM

## 2022-04-04 DIAGNOSIS — R45851 Suicidal ideations: Secondary | ICD-10-CM

## 2022-04-04 DIAGNOSIS — Z20822 Contact with and (suspected) exposure to covid-19: Secondary | ICD-10-CM | POA: Diagnosis not present

## 2022-04-04 DIAGNOSIS — F14188 Cocaine abuse with other cocaine-induced disorder: Secondary | ICD-10-CM | POA: Diagnosis present

## 2022-04-04 DIAGNOSIS — F32A Depression, unspecified: Secondary | ICD-10-CM | POA: Diagnosis not present

## 2022-04-04 DIAGNOSIS — I809 Phlebitis and thrombophlebitis of unspecified site: Secondary | ICD-10-CM | POA: Insufficient documentation

## 2022-04-04 DIAGNOSIS — Z79899 Other long term (current) drug therapy: Secondary | ICD-10-CM | POA: Insufficient documentation

## 2022-04-04 DIAGNOSIS — F1910 Other psychoactive substance abuse, uncomplicated: Secondary | ICD-10-CM

## 2022-04-04 LAB — CBC WITH DIFFERENTIAL/PLATELET
Abs Immature Granulocytes: 0.02 10*3/uL (ref 0.00–0.07)
Basophils Absolute: 0 10*3/uL (ref 0.0–0.1)
Basophils Relative: 0 %
Eosinophils Absolute: 0 10*3/uL (ref 0.0–0.5)
Eosinophils Relative: 0 %
HCT: 49.2 % (ref 39.0–52.0)
Hemoglobin: 16.8 g/dL (ref 13.0–17.0)
Immature Granulocytes: 0 %
Lymphocytes Relative: 18 %
Lymphs Abs: 1.4 10*3/uL (ref 0.7–4.0)
MCH: 30.1 pg (ref 26.0–34.0)
MCHC: 34.1 g/dL (ref 30.0–36.0)
MCV: 88.2 fL (ref 80.0–100.0)
Monocytes Absolute: 0.6 10*3/uL (ref 0.1–1.0)
Monocytes Relative: 7 %
Neutro Abs: 5.8 10*3/uL (ref 1.7–7.7)
Neutrophils Relative %: 75 %
Platelets: 213 10*3/uL (ref 150–400)
RBC: 5.58 MIL/uL (ref 4.22–5.81)
RDW: 11.7 % (ref 11.5–15.5)
WBC: 7.8 10*3/uL (ref 4.0–10.5)
nRBC: 0 % (ref 0.0–0.2)

## 2022-04-04 LAB — COMPREHENSIVE METABOLIC PANEL
ALT: 24 U/L (ref 0–44)
AST: 38 U/L (ref 15–41)
Albumin: 4.5 g/dL (ref 3.5–5.0)
Alkaline Phosphatase: 25 U/L — ABNORMAL LOW (ref 38–126)
Anion gap: 13 (ref 5–15)
BUN: 7 mg/dL (ref 6–20)
CO2: 21 mmol/L — ABNORMAL LOW (ref 22–32)
Calcium: 9.4 mg/dL (ref 8.9–10.3)
Chloride: 105 mmol/L (ref 98–111)
Creatinine, Ser: 0.95 mg/dL (ref 0.61–1.24)
GFR, Estimated: >60 mL/min (ref >60)
Glucose, Bld: 82 mg/dL (ref 70–99)
Potassium: 3.3 mmol/L — ABNORMAL LOW (ref 3.5–5.1)
Sodium: 139 mmol/L (ref 135–145)
Total Bilirubin: 1 mg/dL (ref 0.3–1.2)
Total Protein: 7.4 g/dL (ref 6.5–8.1)

## 2022-04-04 LAB — RAPID URINE DRUG SCREEN, HOSP PERFORMED
Amphetamines: POSITIVE — AB
Barbiturates: NOT DETECTED
Benzodiazepines: NOT DETECTED
Cocaine: POSITIVE — AB
Opiates: NOT DETECTED
Tetrahydrocannabinol: POSITIVE — AB

## 2022-04-04 LAB — RESP PANEL BY RT-PCR (FLU A&B, COVID) ARPGX2
Influenza A by PCR: NEGATIVE
Influenza B by PCR: NEGATIVE
SARS Coronavirus 2 by RT PCR: NEGATIVE

## 2022-04-04 LAB — ETHANOL: Alcohol, Ethyl (B): <10 mg/dL (ref <10)

## 2022-04-04 LAB — SALICYLATE LEVEL: Salicylate Lvl: <7 mg/dL — ABNORMAL LOW (ref 7.0–30.0)

## 2022-04-04 LAB — ACETAMINOPHEN LEVEL: Acetaminophen (Tylenol), Serum: <10 ug/mL — ABNORMAL LOW (ref 10–30)

## 2022-04-04 NOTE — ED Notes (Signed)
EDP made aware of patients elevated blood pressure and depressed mood.  ?

## 2022-04-04 NOTE — ED Provider Notes (Signed)
?[facility]-EMERGENCY DEPT ?Provider Note ? ? ?CSN: [id number] ?Arrival date & time: 04/04/22  1244 ? ?  ? ?History ? ?Chief Complaint  ?Patient presents with  ? Abscess  ? Suicidal  ? ? ?Rickey Miller is a 31 y.o. male who presents emergency department with SI.  He has a past medical history of polysubstance abuse, psych schizoaffective disorder.  He has not been taking his medications regularly.  He had a recent intentional drug overdose.  He states that he feels like he wants to jump off a bridge or walk in front of a car on the highway.  He has been up doing drugs all night.  He states he was using cocaine, methamphetamine and fentanyl.  He denies audiovisual hallucinations or homicidal ideation. ?Patient also states that he donates plasma and noted that he developed a hot, tender, red, cordlike structure in the left arm after recently donating.  He denies chest pain or shortness of breath or significant swelling of the left arm ? ?HPI ? ?  ? ?Home Medications ?Prior to Admission medications   ?Medication Sig Start Date End Date Taking? Authorizing Provider  ?hydrOXYzine (ATARAX) 25 MG tablet Take 1 tablet (25 mg total) by mouth 3 (three) times daily as needed for anxiety. 03/26/22  Yes Karsten Ro, MD  ?prazosin (MINIPRESS) 1 MG capsule Take 1 capsule (1 mg total) by mouth at bedtime. 03/26/22 04/25/22 Yes Karsten Ro, MD  ?sertraline (ZOLOFT) 50 MG tablet Take 50 mg by mouth daily.   Yes [provider]  ?guanFACINE (INTUNIV) 1 MG TB24 ER tablet Take 1 tablet (1 mg total) by mouth daily. ?Patient not taking: Reported on 04/04/2022 03/27/22   Karsten Ro, MD  ?nicotine polacrilex (NICORETTE) 2 MG gum Take 1 each (2 mg total) by mouth as needed for smoking cessation. ?Patient not taking: Reported on 04/04/2022 03/26/22   Karsten Ro, MD  ?OLANZapine (ZYPREXA) 10 MG tablet Take 1 tablet (10 mg total) by mouth at bedtime. ?Patient not taking: Reported on 04/04/2022 03/26/22 04/25/22  Karsten Ro, MD  ?OLANZapine (ZYPREXA) 5 MG tablet Take 1 tablet (5 mg total) by mouth daily. ?Patient not taking: Reported on 04/04/2022 03/26/22 04/25/22  Karsten Ro, MD  ?thiamine 100 MG tablet Take 1 tablet (100 mg total) by mouth daily. ?Patient not taking: Reported on 04/04/2022 03/27/22   Karsten Ro, MD  ?traZODone (DESYREL) 50 MG tablet Take 1 tablet (50 mg total) by mouth at bedtime as needed for sleep. ?Patient not taking: Reported on 04/04/2022 03/26/22   Karsten Ro, MD  ?   ? ?Allergies    ?Patient has no known allergies.   ? ?Review of Systems   ?Review of Systems ? ?Physical Exam ?Updated Vital Signs ?BP (!) 156/103 (BP Location: Right Arm)   Pulse 79   Temp 98 ?F (36.7 ?C) (Oral)   Resp 20   Ht 5\' 7"  (1.702 m)   Wt 81.6 kg   SpO2 98%   BMI 28.19 kg/m?  ?Physical Exam ?Vitals and nursing note reviewed.  ?Constitutional:   ?   General: He is not in acute distress. ?   Appearance: He is well-developed. He is not diaphoretic.  ?HENT:  ?   Head: Normocephalic and atraumatic.  ?Eyes:  ?   General: No scleral icterus. ?   Conjunctiva/sclera: Conjunctivae normal.  ?Cardiovascular:  ?   Rate and Rhythm: Normal rate and regular rhythm.  ?   Heart sounds: Normal heart sounds.  ?Pulmonary:  ?  Effort: Pulmonary effort is normal. No respiratory distress.  ?   Breath sounds: Normal breath sounds.  ?Abdominal:  ?   Palpations: Abdomen is soft.  ?   Tenderness: There is no abdominal tenderness.  ?Musculoskeletal:  ?   Cervical back: Normal range of motion and neck supple.  ?Skin: ?   General: Skin is warm and dry.  ?Neurological:  ?   Mental Status: He is alert.  ?Psychiatric:     ?   Attention and Perception: Attention normal.     ?   Speech: Speech normal.     ?   Behavior: Behavior normal. Behavior is cooperative.     ?   Thought Content: Thought content includes suicidal ideation.  ? ? ?ED Results / Procedures / Treatments   ?Labs ?(all labs ordered are listed, but only abnormal results are displayed) ?Labs  Reviewed  ?COMPREHENSIVE METABOLIC PANEL - Abnormal; Notable for the following components:  ?    Result Value  ? Potassium 3.3 (*)   ? CO2 21 (*)   ? Alkaline Phosphatase 25 (*)   ? All other components within normal limits  ?RAPID URINE DRUG SCREEN, HOSP PERFORMED - Abnormal; Notable for the following components:  ? Cocaine POSITIVE (*)   ? Amphetamines POSITIVE (*)   ? Tetrahydrocannabinol POSITIVE (*)   ? All other components within normal limits  ?SALICYLATE LEVEL - Abnormal; Notable for the following components:  ? Salicylate Lvl <7.0 (*)   ? All other components within normal limits  ?ACETAMINOPHEN LEVEL - Abnormal; Notable for the following components:  ? Acetaminophen (Tylenol), Serum <10 (*)   ? All other components within normal limits  ?RESP PANEL BY RT-PCR (FLU A&B, COVID) ARPGX2  ?ETHANOL  ?CBC WITH DIFFERENTIAL/PLATELET  ? ? ?EKG ?None ? ?Radiology ?No results found. ? ?Procedures ?Procedures  ? ? ?Medications Ordered in ED ?Medications - No data to display ? ?ED Course/ Medical Decision Making/ A&P ?Clinical Course as of 04/04/22 2153  ?Wed Apr 04, 2022  ?2103 Acetaminophen level(!) [AH]  ?2151 Salicylate level(!) [AH]  ?2151 Urine rapid drug screen (hosp performed)(!) [AH]  ?2151 Resp Panel by RT-PCR (Flu A&B, Covid) Urine, Clean Catch [AH]  ?2151 Ethanol [AH]  ?2151 Comprehensive metabolic panel(!) ?Labs reviewed.  Patient medically clear [AH]  ?  ?Clinical Course User Index ?[AH] Arthor Captain, PA-C  ? ?                        ?Medical Decision Making ?Patient here with complaint of suicidal ideation.  He is medically clear after evaluation.  Placed in psychiatric evaluation pathway. ?Patient also having pain in his left forearm.  He has obvious superficial thrombophlebitis.  Treat symptomatically. ? ?Amount and/or Complexity of Data Reviewed ?Labs: ordered. Decision-making details documented in ED Course. ? ? ?Final Clinical Impression(s) / ED Diagnoses ?Final diagnoses:  ?Thrombophlebitis   ?Polysubstance abuse (HCC)  ?Suicidal ideation  ? ? ?Rx / DC Orders ?ED Discharge Orders   ? ? None  ? ?  ? ? ?  ?Arthor Captain, PA-C ?04/04/22 2153 ? ?  ?Wynetta Fines, MD ?04/07/22 1332 ? ?

## 2022-04-04 NOTE — ED Triage Notes (Signed)
Pt states he donates plasma and now has a possible abscess on right forearm.  ?

## 2022-04-04 NOTE — ED Notes (Signed)
Pt changed out into burgundy scrubs. Sitter at bedside.  ? ?Pt belongings placed in patient belonging bag and triage nursing station.  ?

## 2022-04-04 NOTE — ED Notes (Signed)
Patient refused to eat or drink anything RN notify ?
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
## 2022-04-04 NOTE — ED Provider Triage Note (Signed)
Emergency Medicine Provider Triage Evaluation Note ? ?Rickey Miller , a 31 y.o. male  was evaluated in triage.  Pt complains of suicidal. Hx  of drug. Recently Od'd left inpatient early. Feels like he wants to die. He feels like he wants to jump off a bridge or walk in front of a car. ?He also donates plasma and  has had a warm, tender cord in his left forearm ? ?Review of Systems  ?Positive: si ?Negative: avh ? ?Physical Exam  ?BP (!) 170/99 (BP Location: Right Arm)   Pulse 82   Temp 97.7 ?F (36.5 ?C) (Oral)   Resp 18   Ht 5\' 7"  (1.702 m)   Wt 81.6 kg   SpO2 98%   BMI 28.19 kg/m?  ?Gen:   Awake, no distress   ?Resp:  Normal effort  ?MSK:   Moves extremities without difficulty  ?Other:  Left arm pain and warmth ? ?Medical Decision Making  ?Medically screening exam initiated at 2:11 PM.  Appropriate orders placed.  Dino Ivins was informed that the remainder of the evaluation will be completed by another provider, this initial triage assessment does not replace that evaluation, and the importance of remaining in the ED until their evaluation is complete. ? ?+ thrombophlebitis, ?+ Si ?  ?Daphine Deutscher, PA-C ?04/04/22 1418 ? ?

## 2022-04-05 ENCOUNTER — Other Ambulatory Visit (HOSPITAL_COMMUNITY)
Admission: EM | Admit: 2022-04-05 | Discharge: 2022-04-09 | Disposition: A | Discharge status: Other Acute Inpatient Hospital | Payer: No Payment, Other | Attending: Psychiatry | Admitting: Psychiatry

## 2022-04-05 DIAGNOSIS — F1910 Other psychoactive substance abuse, uncomplicated: Secondary | ICD-10-CM | POA: Diagnosis not present

## 2022-04-05 DIAGNOSIS — Z5902 Unsheltered homelessness: Secondary | ICD-10-CM | POA: Insufficient documentation

## 2022-04-05 DIAGNOSIS — Z9151 Personal history of suicidal behavior: Secondary | ICD-10-CM | POA: Diagnosis not present

## 2022-04-05 DIAGNOSIS — R45851 Suicidal ideations: Secondary | ICD-10-CM | POA: Insufficient documentation

## 2022-04-05 DIAGNOSIS — F17210 Nicotine dependence, cigarettes, uncomplicated: Secondary | ICD-10-CM | POA: Insufficient documentation

## 2022-04-05 DIAGNOSIS — Z560 Unemployment, unspecified: Secondary | ICD-10-CM | POA: Insufficient documentation

## 2022-04-05 DIAGNOSIS — F250 Schizoaffective disorder, bipolar type: Secondary | ICD-10-CM | POA: Diagnosis not present

## 2022-04-05 MED ORDER — ACETAMINOPHEN 325 MG PO TABS
650.0000 mg | ORAL_TABLET | Freq: Four times a day (QID) | ORAL | Status: DC | PRN
  Administered 2022-04-05: 650 mg via ORAL
  Filled 2022-04-05: qty 2

## 2022-04-05 MED ORDER — MAGNESIUM HYDROXIDE 400 MG/5ML PO SUSP: 30.0000 mL | Freq: Every day | ORAL | Status: DC | PRN

## 2022-04-05 MED ORDER — TRAZODONE HCL 50 MG PO TABS
50.0000 mg | ORAL_TABLET | Freq: Every evening | ORAL | Status: DC | PRN
  Administered 2022-04-05 – 2022-04-07 (×2): 50 mg via ORAL
  Filled 2022-04-05 (×2): qty 1

## 2022-04-05 MED ORDER — METHOCARBAMOL 500 MG PO TABS: 500.0000 mg | ORAL_TABLET | Freq: Three times a day (TID) | ORAL | Status: DC | PRN

## 2022-04-05 MED ORDER — DICYCLOMINE HCL 20 MG PO TABS: 20.0000 mg | ORAL_TABLET | Freq: Four times a day (QID) | ORAL | Status: DC | PRN

## 2022-04-05 MED ORDER — OLANZAPINE 10 MG PO TABS
10.0000 mg | ORAL_TABLET | Freq: Every day | ORAL | Status: DC
  Administered 2022-04-05: 10 mg via ORAL
  Filled 2022-04-05: qty 1

## 2022-04-05 MED ORDER — LORAZEPAM 1 MG PO TABS
1.0000 mg | ORAL_TABLET | Freq: Four times a day (QID) | ORAL | Status: DC | PRN
Start: 2022-04-05 — End: 2022-04-05
  Administered 2022-04-05: 1 mg via ORAL
  Filled 2022-04-05: qty 1

## 2022-04-05 MED ORDER — OLANZAPINE 5 MG PO TABS
5.0000 mg | ORAL_TABLET | Freq: Every day | ORAL | Status: DC
  Administered 2022-04-05 – 2022-04-06 (×2): 5 mg via ORAL
  Filled 2022-04-05 (×2): qty 1

## 2022-04-05 MED ORDER — LOPERAMIDE HCL 2 MG PO CAPS: 2.0000 mg | ORAL_CAPSULE | ORAL | Status: DC | PRN

## 2022-04-05 MED ORDER — ASPIRIN 325 MG PO TABS
325.0000 mg | ORAL_TABLET | Freq: Two times a day (BID) | ORAL | Status: DC
  Administered 2022-04-05 – 2022-04-09 (×8): 325 mg via ORAL
  Filled 2022-04-05 (×8): qty 1

## 2022-04-05 MED ORDER — PRAZOSIN HCL 1 MG PO CAPS
1.0000 mg | ORAL_CAPSULE | Freq: Every day | ORAL | Status: DC
  Administered 2022-04-05: 1 mg via ORAL
  Filled 2022-04-05 (×2): qty 1

## 2022-04-05 MED ORDER — PANTOPRAZOLE SODIUM 20 MG PO TBEC
20.0000 mg | DELAYED_RELEASE_TABLET | Freq: Every day | ORAL | Status: DC
  Administered 2022-04-05 – 2022-04-09 (×5): 20 mg via ORAL
  Filled 2022-04-05 (×5): qty 1

## 2022-04-05 MED ORDER — GUANFACINE HCL ER 1 MG PO TB24
1.0000 mg | ORAL_TABLET | Freq: Every day | ORAL | Status: DC
  Administered 2022-04-06 – 2022-04-09 (×4): 1 mg via ORAL
  Filled 2022-04-05 (×3): qty 1

## 2022-04-05 MED ORDER — NAPROXEN 500 MG PO TABS
500.0000 mg | ORAL_TABLET | Freq: Two times a day (BID) | ORAL | Status: DC | PRN
Start: 2022-04-05 — End: 2022-04-09

## 2022-04-05 MED ORDER — HYDROXYZINE HCL 25 MG PO TABS
25.0000 mg | ORAL_TABLET | Freq: Three times a day (TID) | ORAL | Status: DC | PRN
  Administered 2022-04-05 – 2022-04-07 (×2): 25 mg via ORAL
  Filled 2022-04-05 (×2): qty 1

## 2022-04-05 MED ORDER — ALUM & MAG HYDROXIDE-SIMETH 200-200-20 MG/5ML PO SUSP
30.0000 mL | ORAL | Status: DC | PRN
Start: 2022-04-05 — End: 2022-04-09

## 2022-04-05 MED ORDER — ONDANSETRON 4 MG PO TBDP: 4.0000 mg | ORAL_TABLET | Freq: Four times a day (QID) | ORAL | Status: DC | PRN
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
## 2022-04-05 NOTE — ED Notes (Signed)
Gave report to Alfonzo Feller, Charity fundraiser at [facility]. Em[facility]ala complete and safe transport called for transport.  ?

## 2022-04-05 NOTE — ED Notes (Signed)
Pt. Is attending AA meeting. ?

## 2022-04-05 NOTE — ED Provider Notes (Signed)
[facility] Continuous Assessment Admission H&P ? ?Date: 04/05/22 ?Patient Name: Rickey Miller ?MRN: [id number] ?Chief Complaint: No chief complaint on file. ?   ? ?Diagnoses:  ?Final diagnoses:  ?Suicidal ideation  ?Substance abuse ([place])  ? ? ?HPI: Rickey Miller 31 y.o male, transfer to [facility] from [facility],  for Si and substance abuse.  According to ED report  He has a past medical history of polysubstance abuse, psych schizoaffective disorder.  He has not been taking his medications regularly.  He had a recent intentional drug overdose.  He states that he feels like he wants to jump off a bridge or walk in front of a car on the highway.  He has been up doing drugs all night.  He states he was using cocaine, methamphetamine and fentanyl.  He denies audiovisual hallucinations or homicidal ideation. ?Patient also states that he donates plasma and noted that he developed a hot, tender, red, cordlike structure in the left arm after recently donating.  He denies chest pain or shortness of breath or significant swelling of the left arm ? ?Observation of patient,  pt is alert and oriented x4, speech is clear but monotone.  Maintain minimal eye contact.  Denies current SI but state he was suicidal yesterday.  Pt denies AVH, HI or paranoia.  Pt not sure why he is here,  per the patient I guess they sent me here for a drug program.   Pt report he drink a few beers day,  uses cocaine,  meth and fentanyl.  Pt say he willing to stay for detox program.   ? ?Pt has phlebitis to left arm,  was noted by ED noted (warm compresses day).  ? ?Recommend [facility]  ? ?PHQ 2-9:  ?Flowsheet Row ED from 04/04/2022 in [place] DEPT  ?Thoughts that you would be better off dead, or of hurting yourself in some way More than half the days  ?PHQ-9 Total Score 21  ? ?  ?  ?[place] ED from 04/05/2022 in [facility] ED from 04/04/2022 in [place] DEPT Admission  (Discharged) from 03/20/2022 in [facility]  ?C-SSRS RISK CATEGORY High Risk High Risk No Risk  ? ?  ?  ? ?Total Time spent with patient: 20 minutes ? ?Musculoskeletal  ?Strength & Muscle Tone: within normal limits ?Gait & Station: normal ?Patient leans: Right ? ?Psychiatric Specialty Exam  ?Presentation ?General Appearance: Casual ? ?Eye Contact:Fair ? ?Speech:Clear and Coherent ? ?Speech Volume:Decreased ? ?Handedness:Ambidextrous ? ? ?Mood and Affect  ?Mood:Anxious; Depressed ? ?Affect:Constricted ? ? ?Thought Process  ?Thought Processes:Linear ? ?Descriptions of Associations:Tangential ? ?Orientation:Full (Time, Place and Person) ? ?Thought Content:Abstract Reasoning ? Diagnosis of Schizophrenia or Schizoaffective disorder in past: No ?  ?Hallucinations:Hallucinations: None ? ?Ideas of Reference:None ? ?Suicidal Thoughts:Suicidal Thoughts: Yes, Passive ?SI Passive Intent and/or Plan: With Plan ? ?Homicidal Thoughts:Homicidal Thoughts: No ? ? ?Sensorium  ?Memory:Immediate Good ? ?Judgment:Fair ? ?Insight:Poor ? ? ?Executive Functions  ?Concentration:Poor ? ?Attention Span:Poor ? ?Recall:Fair ? ?Fund of Knowledge:Poor ? ?Language:Poor ? ? ?Psychomotor Activity  ?Psychomotor Activity:Psychomotor Activity: Normal ? ? ?Assets  ?Assets:Desire for Improvement; Communication Skills ? ? ?Sleep  ?Sleep:Sleep: Fair ? ? ?Nutritional Assessment (For OBS and FBC admissions only) ?Has the patient had a weight loss or gain of 10 pounds or more in the last 3 months?: No ?Has the patient had a decrease in food intake/or appetite?: No ?Does the patient have dental  problems?: No ?Does the patient have eating habits or behaviors that may be indicators of an eating disorder including binging or inducing vomiting?: No ?Has the patient recently lost weight without trying?: 0 ?Has the patient been eating poorly because of a decreased appetite?: 0 ?Malnutrition Screening Tool Score: 0 ? ? ? ?Physical  Exam ?HENT:  ?   Head: Normocephalic.  ?   Nose: Nose normal.  ?Cardiovascular:  ?   Rate and Rhythm: Normal rate.  ?Pulmonary:  ?   Effort: Pulmonary effort is normal.  ?Musculoskeletal:     ?   General: Normal range of motion.  ?   Cervical back: Normal range of motion.  ?Skin: ?   General: Skin is warm.  ?Neurological:  ?   General: No focal deficit present.  ?   Mental Status: He is alert.  ?Psychiatric:     ?   Mood and Affect: Mood normal.     ?   Behavior: Behavior normal.     ?   Thought Content: Thought content normal.     ?   Judgment: Judgment normal.  ? ?Review of Systems  ?Constitutional: Negative.   ?HENT: Negative.    ?Eyes: Negative.   ?Respiratory: Negative.    ?Cardiovascular: Negative.   ?Gastrointestinal: Negative.   ?Genitourinary: Negative.   ?Musculoskeletal: Negative.   ?Skin:  Positive for rash.  ?Neurological: Negative.   ?Endo/Heme/Allergies: Negative.   ?Psychiatric/Behavioral:  Positive for depression, substance abuse and suicidal ideas. The patient is nervous/anxious.   ? ?Blood pressure (!) 142/91, pulse 84, temperature 98.2 ?F (36.8 ?C), temperature source Oral, resp. rate 18, SpO2 100 %. There is no height or weight on file to calculate BMI. ? ?Past Psychiatric History: substance abuse,    ? ?Is the patient at risk to self? Yes  ?Has the patient been a risk to self in the past 6 months? Yes .    ?Has the patient been a risk to self within the distant past? Yes   ?Is the patient a risk to others? No   ?Has the patient been a risk to others in the past 6 months? No   ?Has the patient been a risk to others within the distant past? No  ? ?Past Medical History: No past medical history on file.  ?Past Surgical History:  ?Procedure Laterality Date  ? TONSILLECTOMY    ? ? ?Family History: No family history on file. ? ?Social History:  ?Social History  ? ?Socioeconomic History  ? Marital status: Unknown  ?  Spouse name: Not on file  ? Number of children: Not on file  ? Years of education: Not  on file  ? Highest education level: Not on file  ?Occupational History  ? Not on file  ?Tobacco Use  ? Smoking status: Every Day  ?  Packs/day: 1.00  ?  Years: 13.00  ?  Pack years: 13.00  ?  Types: Cigarettes  ? Smokeless tobacco: Never  ?Vaping Use  ? Vaping Use: Former  ?Substance and Sexual Activity  ? Alcohol use: Yes  ?  Comment: Friday, Sat  ? Drug use: Yes  ?  Types: Marijuana  ?  Comment: weekly; unable to quantify  ? Sexual activity: Yes  ?  Birth control/protection: None  ?Other Topics Concern  ? Not on file  ?Social History Narrative  ? Not on file  ? ?Social Determinants of Health  ? ?Financial Resource Strain: Not on file  ?Food Insecurity: Not on file  ?  Transportation Needs: Not on file  ?Physical Activity: Not on file  ?Stress: Not on file  ?Social Connections: Not on file  ?Intimate Partner Violence: Not on file  ? ? ?SDOH:  ?SDOH Screenings  ? ?Alcohol Screen: Medium Risk  ? Last Alcohol Screening Score (AUDIT): 13  ?Depression (PHQ2-9): Medium Risk  ? PHQ-2 Score: 21  ?Financial Resource Strain: Not on file  ?Food Insecurity: Not on file  ?Housing: Not on file  ?Physical Activity: Not on file  ?Social Connections: Not on file  ?Stress: Not on file  ?Tobacco Use: High Risk  ? Smoking Tobacco Use: Every Day  ? Smokeless Tobacco Use: Never  ? Passive Exposure: Not on file  ?Transportation Needs: Not on file  ? ? ?Last Labs:  ?Admission on 04/04/2022, Discharged on 04/05/2022  ?Component Date Value Ref Range Status  ? Sodium 04/04/2022 139  135 - 145 mmol/L Final  ? Potassium 04/04/2022 3.3 (L)  3.5 - 5.1 mmol/L Final  ? Chloride 04/04/2022 105  98 - 111 mmol/L Final  ? CO2 04/04/2022 21 (L)  22 - 32 mmol/L Final  ? Glucose, Bld 04/04/2022 82  70 - 99 mg/dL Final  ? Glucose reference range applies only to samples taken after fasting for at least 8 hours.  ? BUN 04/04/2022 7  6 - 20 mg/dL Final  ? Creatinine, Ser 04/04/2022 0.95  0.61 - 1.24 mg/dL Final  ? Calcium 04/04/2022 9.4  8.9 - 10.3 mg/dL Final   ? Total Protein 04/04/2022 7.4  6.5 - 8.1 g/dL Final  ? Albumin 04/04/2022 4.5  3.5 - 5.0 g/dL Final  ? AST 04/04/2022 38  15 - 41 U/L Final  ? ALT 04/04/2022 24  0 - 44 U/L Final  ? Alkaline Phosphat

## 2022-04-05 NOTE — ED Notes (Signed)
Pt refused to attend group.

## 2022-04-05 NOTE — ED Notes (Addendum)
Patient c/o heart palpitations. EKG has just been completed and no c/o chest pain. Patient appears to be anxious. Notified Dr. Preston Fleeting. New order for ativan PO ordered and given. Will continue to monitor.  ?

## 2022-04-05 NOTE — ED Provider Notes (Signed)
Patient is being transferred to behavioral health urgent care, they were concerned about his phlebitis.  I have evaluated the patient, he has obvious superficial thrombophlebitis of the left forearm.  Treatment is warm compresses and aspirin 325 mg twice a day. ?  ?Dione Booze, MD ?04/05/22 0109 ? ?

## 2022-04-05 NOTE — Clinical Social Work Psych Note (Signed)
LCSW Initial Note ? ?LCSW and Dr. Serafina Mitchell, MD met with Nell for introduction and to begin discussions regarding treatment and potential discharging   ? ?Per Dr. Serafina Mitchell, MD the patient meets criteria for inpatient psychiatric treatment.  ? ?LCSW referred the patient to the following facilities for review.  ? ?- [facility] ?[facility]   ?[facility]   ?[facility]   ?[facility]   ?[facility]   ?[facility]   ?[place]   ?[facility]   ?[facility]   ?[place]   ?[facility]   ?[facility]   ? ? ?LCSW will continue to follow for possible placement.  ? ?Radonna Ricker, MSW, LCSW ?Clinical Education officer, museum Insurance claims handler) ?[facility]  ? ?
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
## 2022-04-05 NOTE — Progress Notes (Signed)
Patient ate lunch and then returned to his room where he has remained.  Patient has been calm, quiet and in good behavioral control.  Patient has made needs known appropriately with soft speech and minimal eye contact.  He does not appear as guarded or angry as on upon meeting him this morning.  Denies avh shi or plan at this time.  Will monitor and provide safe environment.   ?

## 2022-04-05 NOTE — ED Notes (Signed)
Pt. Has been very respectful and hasn't shown any signs of aggression so far.  ?

## 2022-04-05 NOTE — ED Notes (Signed)
Pt. Came up to the nurses station speaking with the RN about meds.  ?

## 2022-04-05 NOTE — ED Notes (Signed)
Pt refused to eat

## 2022-04-05 NOTE — ED Notes (Signed)
Pt walked up to nurses station asking for a snack. Pt. Got a snack and went back to his room.  ?

## 2022-04-05 NOTE — BH Assessment (Signed)
Pt has been accepted at the [facility] and can arrive at any time. This information was relayed to pt's team at 0038. ? ?Accepting: Sindy Guadeloupe, NP ?Attending: Dr. Lucianne Muss ?Call to Report: [phone] ?

## 2022-04-05 NOTE — ED Notes (Addendum)
Pt admitted to [facility] due to SI with plan to jump off bridge and substance use. Pt A&O x4, calm and cooperative. Pt denies current SI/HI/AVH and is able to verbally contract for safety on unit. Pt tolerated admission process and skin assessment well. Pt has warm, inflamed area to left inner arm. Sindy Guadeloupe, NP notified via secure chat. No new orders received. Pt ambulated independently to unit. Oriented to unit/staff. PB&J and juice given per pt request. No signs of acute distress noted. Will continue to monitor for safety.  ?

## 2022-04-05 NOTE — ED Notes (Signed)
Pt sitting in dining room watching TV. A&O x4, calm and cooperative. Pt denies current SI/HI. Pt reports AH, "I'm hearing voices. Can I have my medicine early?" Pt also reports headache 4/10. PRN Tylenol given. Sindy Guadeloupe, NP notified regarding pt's request to take bedtime medication early. MHT provided snacks then pt went to lay down in his room. No signs of acute distress noted. Will continue to monitor for safety.  ?

## 2022-04-06 DIAGNOSIS — F250 Schizoaffective disorder, bipolar type: Secondary | ICD-10-CM | POA: Diagnosis not present

## 2022-04-06 DIAGNOSIS — F1910 Other psychoactive substance abuse, uncomplicated: Secondary | ICD-10-CM | POA: Diagnosis not present

## 2022-04-06 DIAGNOSIS — Z9151 Personal history of suicidal behavior: Secondary | ICD-10-CM | POA: Diagnosis not present

## 2022-04-06 DIAGNOSIS — R45851 Suicidal ideations: Secondary | ICD-10-CM | POA: Diagnosis not present

## 2022-04-06 MED ORDER — OLANZAPINE 10 MG PO TABS
10.0000 mg | ORAL_TABLET | Freq: Two times a day (BID) | ORAL | Status: DC
Start: 2022-04-06 — End: 2022-04-09
  Administered 2022-04-06 – 2022-04-09 (×6): 10 mg via ORAL
  Filled 2022-04-06 (×6): qty 1

## 2022-04-06 MED ORDER — OLANZAPINE 5 MG PO TBDP
5.0000 mg | ORAL_TABLET | Freq: Once | ORAL | Status: AC
  Administered 2022-04-06: 5 mg via ORAL
  Filled 2022-04-06: qty 1

## 2022-04-06 NOTE — ED Notes (Signed)
Pt asleep in bed. Respirations even and unlabored. Will continue to monitor for safety. ?

## 2022-04-06 NOTE — Group Note (Signed)
Group Topic: Relapse and Recovery  ?Group Date: 04/06/2022 ?Start Time: 1315 ?End Time: 1330 ?Facilitators: Doyne Keel E  ?Department: [facility] ? ?Number of Participants: 1  ?Group Focus: coping skills and relapse prevention ?Treatment Modality:  Patient-Centered Therapy ?Interventions utilized were problem solving ?Purpose: relapse prevention strategies ? ?Name: Rickey Miller Date of Birth: 1991-11-26  ?MR: [id number]   ? ?Level of Participation: minimal ?Quality of Participation: cooperative ?Interactions with others: gave feedback ?Mood/Affect: appropriate ?Triggers (if applicable): n/a ?Cognition: coherent/clear ?Progress: Moderate ?Response: n/a ?Plan: follow-up needed ? ?Patients Problems:  ?Patient Active Problem List  ? Diagnosis Date Noted  ? Substance abuse (HCC) 04/05/2022  ? Cocaine abuse (HCC) 03/21/2022  ? Cannabis abuse 03/21/2022  ? Schizoaffective disorder (HCC) 03/21/2022  ? Social anxiety disorder 03/21/2022  ? GAD (generalized anxiety disorder) 03/21/2022  ? Nicotine dependence 03/21/2022  ? Alcohol use disorder 03/21/2022  ? Intentional drug overdose (HCC) 03/18/2022  ? Polysubstance abuse (HCC) 03/18/2022  ? Hypokalemia 03/18/2022  ? Hypomagnesemia 03/18/2022  ? Metabolic acidosis 03/18/2022  ? Drug overdose, intentional (HCC) 03/18/2022  ?  ?

## 2022-04-06 NOTE — ED Notes (Signed)
Pt at nurses station speaking with staff about concerns he has if he were to discharge.  Pt reports when he is home he is not compliant with his medications and that causes him to do "stupid stuff."  Pt referred to attempting to OD on sleeping medications in the past because of thoughts of worthlessness.  Pt states he worries he has nothing to contribute to the world and that is why he attempted to OD in the past. Pt currently in dayroom eating lunch. Breathing is even and unlabored. Will encourage pt to follow treatment planning at attend groups.    ?

## 2022-04-06 NOTE — ED Notes (Signed)
Pt currently watching TV ?

## 2022-04-06 NOTE — Clinical Social Work Psych Note (Incomplete)
LCSW Update Notes ? ? ?[name] shared that he feels better today, however he continues to plan his suicide once he discharges from the [facility]. [name] shared that he plans to jump off of any bridge in [place] to ensure he  ? ? ? ? ? ?LCSW and Dr. Serafina Mitchell, MD met with [name] for introduction and to begin discussions regarding treatment and potential discharging   ?  ?Per Dr. Serafina Mitchell, MD the patient meets criteria for inpatient psychiatric treatment.  ?  ?LCSW referred the patient to the following facilities for review.  ?  ?- [facility] ?[facility]   ?[facility]   ?[facility]   ?[facility]   ?[facility]   ?[facility]   ?[place]   ?[place]   ?[facility]   ?[place]   ?[facility]   ?[facility]   ?  ?  ?LCSW will continue to follow for possible placement.  ?  ?

## 2022-04-06 NOTE — Progress Notes (Addendum)
Pt is resting quietly in his room. Pt is alert and oriented with blunt affect. Pt did not voice any complaint of pain or discomfort. No signs of acute distress noted. Pt received scheduled meds with no issue. Pt denies current SI/HI/AVH. Staff will monitor for pt's safety. ?

## 2022-04-06 NOTE — Progress Notes (Signed)
Pt had dinner and is currently resting quietly. No distress noted or concerns voiced. Pt's safety is maintained. ?

## 2022-04-06 NOTE — ED Notes (Signed)
Pt ate dinner and is currently attending to ADLs.  Breathing is even and unlabored.  Will continue to monitor for safety. ?

## 2022-04-06 NOTE — ED Notes (Signed)
Pt in his room sleeping@this  time. Calm and cooperative. No c/ o pain or distress ?

## 2022-04-06 NOTE — Progress Notes (Signed)
Pt is resting quietly. No distress noted or concerns voiced. Pt's safety is maintained. ?

## 2022-04-06 NOTE — ED Notes (Signed)
Pt sleeping@this time. Breathing even and unlabored will continue to monitor for safety 

## 2022-04-06 NOTE — ED Notes (Signed)
Pt ate his meals and had a good shower ?

## 2022-04-06 NOTE — ED Provider Notes (Addendum)
Behavioral Health Progress Note ? ?Date and Time: 04/06/2022 11:34 AM ?Name: Rickey Miller ?MRN:  [id number] ? ?Subjective:   ?31 yo male with history of schizoaffective disorder, bipolar disorder, polysubstance abuse, multiple suicide attempts who presented to the [facility] ED with SI with a plan of jumping off a bridge on 5/4. Patient was transferred to the [facility] on 5/5 and then admitted to the [facility] for further treatment. UDS+amphetamines, cocaine, Thc. Etoh neg.  ? ? ?Patient seen and chart reviewed-patient reported to staff overnight that he was hearing voices and requested medications early which he received. Patient seen in conjunction with LCSW this morning. Affect is blunt, patient makes minimal eye contact and appears tearful at times. Patient describes his mood as "better" than yesterday; however, continues to report low mood. He denies current SI but becomes tearful and states , "I know what I am going to do when I get out there". When asked for clarification, he states that when he leaves the hospital he will jump off a bridge to commit suicide. He denies HI/VH. He reports AH that occurred most recently yesterday. He states that he is "embarrassed to say" when he is asked about the content of the AH.  He denies all physical complaints today. ? ? Discussed with patient that zyprexa dose will be increased to help with the voices and that his treatment team continues to search for inpatient psychiatric placement based on his symptoms/acuity. Patient is in agreement. Patient was given the opportunity to ask questions and  All questions answered. Patient verbalized understanding regarding plan of care.  ? ? ? ? ?Diagnosis:  ?Final diagnoses:  ?Suicidal ideation  ?Substance abuse (HCC)  ?Schizoaffective disorder, bipolar type (HCC)  ? ? ?Total Time spent with patient: 20 minutes ? ?Past Psychiatric History: schizoaffective disorder, polysubstance abuse, bipoalr disorder ?Past Medical History: No past medical history on  file.  ?Past Surgical History:  ?Procedure Laterality Date  ? TONSILLECTOMY    ? ?Family History: No family history on file. ?Family Psychiatric  History: Brother-?Bipolar schizophrenia ?Social History:  ?Social History  ? ?Substance and Sexual Activity  ?Alcohol Use Yes  ? Comment: Friday, Sat  ?   ?Social History  ? ?Substance and Sexual Activity  ?Drug Use Yes  ? Types: Marijuana  ? Comment: weekly; unable to quantify  ?  ?Social History  ? ?Socioeconomic History  ? Marital status: Unknown  ?  Spouse name: Not on file  ? Number of children: Not on file  ? Years of education: Not on file  ? Highest education level: Not on file  ?Occupational History  ? Not on file  ?Tobacco Use  ? Smoking status: Every Day  ?  Packs/day: 1.00  ?  Years: 13.00  ?  Pack years: 13.00  ?  Types: Cigarettes  ? Smokeless tobacco: Never  ?Vaping Use  ? Vaping Use: Former  ?Substance and Sexual Activity  ? Alcohol use: Yes  ?  Comment: Friday, Sat  ? Drug use: Yes  ?  Types: Marijuana  ?  Comment: weekly; unable to quantify  ? Sexual activity: Yes  ?  Birth control/protection: None  ?Other Topics Concern  ? Not on file  ?Social History Narrative  ? Not on file  ? ?Social Determinants of Health  ? ?Financial Resource Strain: Not on file  ?Food Insecurity: Not on file  ?Transportation Needs: Not on file  ?Physical Activity: Not on file  ?Stress: Not on file  ?Social Connections: Not on  file  ? ?SDOH:  ?SDOH Screenings  ? ?Alcohol Screen: Medium Risk  ? Last Alcohol Screening Score (AUDIT): 13  ?Depression (PHQ2-9): Medium Risk  ? PHQ-2 Score: 21  ?Financial Resource Strain: Not on file  ?Food Insecurity: Not on file  ?Housing: Not on file  ?Physical Activity: Not on file  ?Social Connections: Not on file  ?Stress: Not on file  ?Tobacco Use: High Risk  ? Smoking Tobacco Use: Every Day  ? Smokeless Tobacco Use: Never  ? Passive Exposure: Not on file  ?Transportation Needs: Not on file  ? ?Additional Social History:  ?  ?  ?  ?  ?  ?  ?  ?  ?   ?  ?  ? ?Sleep: Poor ? ?Appetite:  Fair ? ?Current Medications:  ?Current Facility-Administered Medications  ?Medication Dose Route Frequency Provider Last Rate Last Admin  ? acetaminophen (TYLENOL) tablet 650 mg  650 mg Oral Q6H PRN Sindy GuadeloupeWilliams, Roy, NP   650 mg at 04/05/22 1905  ? alum & mag hydroxide-simeth (MAALOX/MYLANTA) 200-200-20 MG/5ML suspension 30 mL  30 mL Oral Q4H PRN Sindy GuadeloupeWilliams, Roy, NP      ? aspirin tablet 325 mg  325 mg Oral BID Nira ConnBerry, Jason A, NP   325 mg at 04/06/22 0934  ? dicyclomine (BENTYL) tablet 20 mg  20 mg Oral Q6H PRN Sindy GuadeloupeWilliams, Roy, NP      ? guanFACINE (INTUNIV) ER tablet 1 mg  1 mg Oral Daily Estella HuskLaubach, Dayane Hillenburg S, MD   1 mg at 04/06/22 [id number]  ? hydrOXYzine (ATARAX) tablet 25 mg  25 mg Oral TID PRN Sindy GuadeloupeWilliams, Roy, NP   25 mg at 04/05/22 2110  ? loperamide (IMODIUM) capsule 2-4 mg  2-4 mg Oral PRN Sindy GuadeloupeWilliams, Roy, NP      ? magnesium hydroxide (MILK OF MAGNESIA) suspension 30 mL  30 mL Oral Daily PRN Sindy GuadeloupeWilliams, Roy, NP      ? methocarbamol (ROBAXIN) tablet 500 mg  500 mg Oral Q8H PRN Sindy GuadeloupeWilliams, Roy, NP      ? naproxen (NAPROSYN) tablet 500 mg  500 mg Oral BID PRN Sindy GuadeloupeWilliams, Roy, NP      ? OLANZapine (ZYPREXA) tablet 10 mg  10 mg Oral QHS Estella HuskLaubach, Aleiyah Halpin S, MD   10 mg at 04/05/22 2109  ? OLANZapine (ZYPREXA) tablet 5 mg  5 mg Oral Daily Estella HuskLaubach, Jarome Trull S, MD   5 mg at 04/06/22 [id number]  ? ondansetron (ZOFRAN-ODT) disintegrating tablet 4 mg  4 mg Oral Q6H PRN Sindy GuadeloupeWilliams, Roy, NP      ? pantoprazole (PROTONIX) EC tablet 20 mg  20 mg Oral Daily Nira ConnBerry, Jason A, NP   20 mg at 04/06/22 0934  ? prazosin (MINIPRESS) capsule 1 mg  1 mg Oral QHS Estella HuskLaubach, Emmajane Altamura S, MD   1 mg at 04/05/22 2109  ? traZODone (DESYREL) tablet 50 mg  50 mg Oral QHS PRN Estella HuskLaubach, Ritika Hellickson S, MD   50 mg at 04/05/22 2109  ? ?Current Outpatient Medications  ?Medication Sig Dispense Refill  ? guanFACINE (INTUNIV) 1 MG TB24 ER tablet Take 1 tablet (1 mg total) by mouth daily. (Patient not taking: Reported on 04/04/2022) 30 tablet 0  ?  hydrOXYzine (ATARAX) 25 MG tablet Take 1 tablet (25 mg total) by mouth 3 (three) times daily as needed for anxiety. 30 tablet 0  ? nicotine polacrilex (NICORETTE) 2 MG gum Take 1 each (2 mg total) by mouth as needed for smoking cessation. (Patient not taking: Reported on 04/04/2022) 100 tablet  0  ? OLANZapine (ZYPREXA) 10 MG tablet Take 1 tablet (10 mg total) by mouth at bedtime. (Patient not taking: Reported on 04/04/2022) 30 tablet 0  ? OLANZapine (ZYPREXA) 5 MG tablet Take 1 tablet (5 mg total) by mouth daily. (Patient not taking: Reported on 04/04/2022) 30 tablet 0  ? prazosin (MINIPRESS) 1 MG capsule Take 1 capsule (1 mg total) by mouth at bedtime. 30 capsule 0  ? sertraline (ZOLOFT) 50 MG tablet Take 50 mg by mouth daily.    ? thiamine 100 MG tablet Take 1 tablet (100 mg total) by mouth daily. (Patient not taking: Reported on 04/04/2022) 30 tablet 0  ? traZODone (DESYREL) 50 MG tablet Take 1 tablet (50 mg total) by mouth at bedtime as needed for sleep. (Patient not taking: Reported on 04/04/2022) 30 tablet 0  ? ? ?Labs  ?Lab Results:  ?Admission on 04/04/2022, Discharged on 04/05/2022  ?Component Date Value Ref Range Status  ? Sodium 04/04/2022 139  135 - 145 mmol/L Final  ? Potassium 04/04/2022 3.3 (L)  3.5 - 5.1 mmol/L Final  ? Chloride 04/04/2022 105  98 - 111 mmol/L Final  ? CO2 04/04/2022 21 (L)  22 - 32 mmol/L Final  ? Glucose, Bld 04/04/2022 82  70 - 99 mg/dL Final  ? Glucose reference range applies only to samples taken after fasting for at least 8 hours.  ? BUN 04/04/2022 7  6 - 20 mg/dL Final  ? Creatinine, Ser 04/04/2022 0.95  0.61 - 1.24 mg/dL Final  ? Calcium 79/01/4096 9.4  8.9 - 10.3 mg/dL Final  ? Total Protein 04/04/2022 7.4  6.5 - 8.1 g/dL Final  ? Albumin 35/32/9924 4.5  3.5 - 5.0 g/dL Final  ? AST 26/83/4196 38  15 - 41 U/L Final  ? ALT 04/04/2022 24  0 - 44 U/L Final  ? Alkaline Phosphatase 04/04/2022 25 (L)  38 - 126 U/L Final  ? Total Bilirubin 04/04/2022 1.0  0.3 - 1.2 mg/dL Final  ? GFR,  Estimated 04/04/2022 >60  >60 mL/min Final  ? Comment: (NOTE) ?Calculated using the CKD-EPI Creatinine Equation (2021) ?  ? Anion gap 04/04/2022 13  5 - 15 Final  ? Performed at [facility],

## 2022-04-07 DIAGNOSIS — F250 Schizoaffective disorder, bipolar type: Secondary | ICD-10-CM | POA: Diagnosis not present

## 2022-04-07 DIAGNOSIS — R45851 Suicidal ideations: Secondary | ICD-10-CM | POA: Diagnosis not present

## 2022-04-07 DIAGNOSIS — F1910 Other psychoactive substance abuse, uncomplicated: Secondary | ICD-10-CM | POA: Diagnosis not present

## 2022-04-07 DIAGNOSIS — Z9151 Personal history of suicidal behavior: Secondary | ICD-10-CM | POA: Diagnosis not present

## 2022-04-07 NOTE — Progress Notes (Addendum)
Per Hazeline Junker, patient meets criteria for inpatient treatment. There are no available beds at [facility] today, per [facility]. CSW faxed referrals to the following facilities for review: ? ?[address] [place] [address] [phone] --  ?[place] N/[address], [place] [address] [phone] --  ?[facility]  Pending - Request Sent N/A [place], [place] [address] [phone] --  ?[place] Sent N/A [address] [place], [place] [address] [phone] --  ?[facility]  Pending - Request Sent N/A [address] [place] [address] [phone] --  ?[place] N/A [place], [place] [address] [phone] --  ?[facility]  Pending - Request Sent N/A [place] [address] [place] [address] [phone] --  ?[facility]  Pending - Request Sent N/A [address] [place] [address] [phone] --  ?[facility]  Pending - Request Sent N/A [address] [place] [address] [phone] --  ?[place] N/A [address] [place] [address] [phone] --  ?[facility]  Pending - Request Sent N/A [address], [place] [id number] [phone] --  ?[place] N/A [address] [place] [address] [phone] --  ?[place]  Pending - No Request Sent N/A [address], [place] [address] [phone] --  ?[facility]   Pending - No Request [address] [place] [address] [phone] --  ?[facility]  Pending - No Request Sent N/A [address] [place] [address] [phone] --  ?[facility]  Pending - No Request Sent N/A [address] [place] [address] [phone] --  ?[facility]  Pending - No Request Sent N/A [address] [place] [address] [phone] --  ?[facility]  Pending - No Request Sent N/A [address] [place] [address] [phone] --  ?[place]  Pending - No Request Sent N/A [address], [place] [address] [phone] --  ?[facility]  Pending - No Request Sent N/A [address] [place] [address] [phone] --  ? ?TTS will continue to seek bed placement. ? ?[place], MSW, LCSW-A, LCAS-A ?Phone: [phone] ?Disposition/TOC ? ?

## 2022-04-07 NOTE — ED Notes (Signed)
Patient eating breakfast at this time.

## 2022-04-07 NOTE — ED Notes (Signed)
Pt laying in bed resting with eyes closed. No c/o pain or distress. Will continue to monitor for safety ?

## 2022-04-07 NOTE — ED Provider Notes (Signed)
Behavioral Health Progress Note ? ?Date and Time: 04/07/2022 3:50 PM ?Name: Rickey Miller ?MRN:  [id number] ? ?Subjective:  Rickey Miller 31 y.o., male patient presented who initially who presented to the [facility] ED with SI with a plan of jumping off a bridge on 5/4. Patient was transferred to the [facility] on 5/5 and then admitted to the [facility] for further treatment. ? ?Rickey Miller, 31 y.o., male patient seen face to face by this provider, consulted with Dr. Gasper Sells; and chart reviewed on 04/07/22.  Patient has a psychiatric history of schizoaffective disorder, bipolar disorder, polysubstance abuse, and multiple suicide attempts.  On admission his UDS is positive for amphetamines, cocaine, THC.  BAL less than 10.  He is currently prescribed Zyprexa 10 mg twice daily, Intuniv ER 1 mg daily, and Minipress 1 mg nightly.  He is tolerating medications without any adverse reactions. ? ?During evaluation [name] is laying in his bed awake.  He is alert/oriented x4.  He has normal speech.  He has a blunted flattened affect.  He is easily distracted.  He appears to be scattered in his thought process at times but is able to answer questions appropriately.  He endorses depression but states he is "much better "today. He asked what his plan was and how long he would be staying here.  Explained to patient that he has been recommended for inpatient psychiatric treatment.  He is in agreement states, "what ever you think I need".  On today's assessment patient is denying SI.  But he can not  contract for safety. He is denying HI/AVH.  He looks around the room a few times during the assessment and appears to be responding to internal/external stimuli.  States the last time he was hearing any auditory hallucinations was yesterday.  Reports decreased sleep and appetite. ? ? ? ?Diagnosis:  ?Final diagnoses:  ?Suicidal ideation  ?Substance abuse (HCC)  ?Schizoaffective disorder, bipolar type (HCC)  ? ? ?Total Time spent with  patient: 30 minutes ? ?Past Psychiatric History: See H&P ?Past Medical History: No past medical history on file.  ?Past Surgical History:  ?Procedure Laterality Date  ? TONSILLECTOMY    ? ?Family History: No family history on file. ?Family Psychiatric  History: See H&P ?Social History:  ?Social History  ? ?Substance and Sexual Activity  ?Alcohol Use Yes  ? Comment: Friday, Sat  ?   ?Social History  ? ?Substance and Sexual Activity  ?Drug Use Yes  ? Types: Marijuana  ? Comment: weekly; unable to quantify  ?  ?Social History  ? ?Socioeconomic History  ? Marital status: Unknown  ?  Spouse name: Not on file  ? Number of children: Not on file  ? Years of education: Not on file  ? Highest education level: Not on file  ?Occupational History  ? Not on file  ?Tobacco Use  ? Smoking status: Every Day  ?  Packs/day: 1.00  ?  Years: 13.00  ?  Pack years: 13.00  ?  Types: Cigarettes  ? Smokeless tobacco: Never  ?Vaping Use  ? Vaping Use: Former  ?Substance and Sexual Activity  ? Alcohol use: Yes  ?  Comment: Friday, Sat  ? Drug use: Yes  ?  Types: Marijuana  ?  Comment: weekly; unable to quantify  ? Sexual activity: Yes  ?  Birth control/protection: None  ?Other Topics Concern  ? Not on file  ?Social History Narrative  ? Not on file  ? ?Social Determinants of Health  ? ?  Financial Resource Strain: Not on file  ?Food Insecurity: Not on file  ?Transportation Needs: Not on file  ?Physical Activity: Not on file  ?Stress: Not on file  ?Social Connections: Not on file  ? ?SDOH:  ?SDOH Screenings  ? ?Alcohol Screen: Medium Risk  ? Last Alcohol Screening Score (AUDIT): 13  ?Depression (PHQ2-9): Medium Risk  ? PHQ-2 Score: 21  ?Financial Resource Strain: Not on file  ?Food Insecurity: Not on file  ?Housing: Not on file  ?Physical Activity: Not on file  ?Social Connections: Not on file  ?Stress: Not on file  ?Tobacco Use: High Risk  ? Smoking Tobacco Use: Every Day  ? Smokeless Tobacco Use: Never  ? Passive Exposure: Not on file   ?Transportation Needs: Not on file  ? ?Additional Social History:  ?  ?  ?  ?  ?  ?  ?  ?  ?  ?  ?  ? ?Sleep: Fair ? ?Appetite:  Fair ? ?Current Medications:  ?Current Facility-Administered Medications  ?Medication Dose Route Frequency Provider Last Rate Last Admin  ? acetaminophen (TYLENOL) tablet 650 mg  650 mg Oral Q6H PRN Sindy GuadeloupeWilliams, Roy, NP   650 mg at 04/05/22 1905  ? alum & mag hydroxide-simeth (MAALOX/MYLANTA) 200-200-20 MG/5ML suspension 30 mL  30 mL Oral Q4H PRN Sindy GuadeloupeWilliams, Roy, NP      ? aspirin tablet 325 mg  325 mg Oral BID Nira ConnBerry, Jason A, NP   325 mg at 04/07/22 1042  ? dicyclomine (BENTYL) tablet 20 mg  20 mg Oral Q6H PRN Sindy GuadeloupeWilliams, Roy, NP      ? guanFACINE (INTUNIV) ER tablet 1 mg  1 mg Oral Daily Estella HuskLaubach, Katherine S, MD   1 mg at 04/07/22 1042  ? hydrOXYzine (ATARAX) tablet 25 mg  25 mg Oral TID PRN Sindy GuadeloupeWilliams, Roy, NP   25 mg at 04/05/22 2110  ? loperamide (IMODIUM) capsule 2-4 mg  2-4 mg Oral PRN Sindy GuadeloupeWilliams, Roy, NP      ? magnesium hydroxide (MILK OF MAGNESIA) suspension 30 mL  30 mL Oral Daily PRN Sindy GuadeloupeWilliams, Roy, NP      ? methocarbamol (ROBAXIN) tablet 500 mg  500 mg Oral Q8H PRN Sindy GuadeloupeWilliams, Roy, NP      ? naproxen (NAPROSYN) tablet 500 mg  500 mg Oral BID PRN Sindy GuadeloupeWilliams, Roy, NP      ? OLANZapine (ZYPREXA) tablet 10 mg  10 mg Oral BID Estella HuskLaubach, Katherine S, MD   10 mg at 04/07/22 1042  ? ondansetron (ZOFRAN-ODT) disintegrating tablet 4 mg  4 mg Oral Q6H PRN Sindy GuadeloupeWilliams, Roy, NP      ? pantoprazole (PROTONIX) EC tablet 20 mg  20 mg Oral Daily Nira ConnBerry, Jason A, NP   20 mg at 04/07/22 1042  ? prazosin (MINIPRESS) capsule 1 mg  1 mg Oral QHS Estella HuskLaubach, Katherine S, MD   1 mg at 04/05/22 2109  ? traZODone (DESYREL) tablet 50 mg  50 mg Oral QHS PRN Estella HuskLaubach, Katherine S, MD   50 mg at 04/05/22 2109  ? ?Current Outpatient Medications  ?Medication Sig Dispense Refill  ? guanFACINE (INTUNIV) 1 MG TB24 ER tablet Take 1 tablet (1 mg total) by mouth daily. (Patient not taking: Reported on 04/04/2022) 30 tablet 0  ? hydrOXYzine  (ATARAX) 25 MG tablet Take 1 tablet (25 mg total) by mouth 3 (three) times daily as needed for anxiety. 30 tablet 0  ? nicotine polacrilex (NICORETTE) 2 MG gum Take 1 each (2 mg total) by mouth as needed  for smoking cessation. (Patient not taking: Reported on 04/04/2022) 100 tablet 0  ? OLANZapine (ZYPREXA) 10 MG tablet Take 1 tablet (10 mg total) by mouth at bedtime. (Patient not taking: Reported on 04/04/2022) 30 tablet 0  ? OLANZapine (ZYPREXA) 5 MG tablet Take 1 tablet (5 mg total) by mouth daily. (Patient not taking: Reported on 04/04/2022) 30 tablet 0  ? prazosin (MINIPRESS) 1 MG capsule Take 1 capsule (1 mg total) by mouth at bedtime. 30 capsule 0  ? sertraline (ZOLOFT) 50 MG tablet Take 50 mg by mouth daily.    ? thiamine 100 MG tablet Take 1 tablet (100 mg total) by mouth daily. (Patient not taking: Reported on 04/04/2022) 30 tablet 0  ? traZODone (DESYREL) 50 MG tablet Take 1 tablet (50 mg total) by mouth at bedtime as needed for sleep. (Patient not taking: Reported on 04/04/2022) 30 tablet 0  ? ? ?Labs  ?Lab Results:  ?Admission on 04/04/2022, Discharged on 04/05/2022  ?Component Date Value Ref Range Status  ? Sodium 04/04/2022 139  135 - 145 mmol/L Final  ? Potassium 04/04/2022 3.3 (L)  3.5 - 5.1 mmol/L Final  ? Chloride 04/04/2022 105  98 - 111 mmol/L Final  ? CO2 04/04/2022 21 (L)  22 - 32 mmol/L Final  ? Glucose, Bld 04/04/2022 82  70 - 99 mg/dL Final  ? Glucose reference range applies only to samples taken after fasting for at least 8 hours.  ? BUN 04/04/2022 7  6 - 20 mg/dL Final  ? Creatinine, Ser 04/04/2022 0.95  0.61 - 1.24 mg/dL Final  ? Calcium 50/27/7412 9.4  8.9 - 10.3 mg/dL Final  ? Total Protein 04/04/2022 7.4  6.5 - 8.1 g/dL Final  ? Albumin 87/86/7672 4.5  3.5 - 5.0 g/dL Final  ? AST 09/47/0962 38  15 - 41 U/L Final  ? ALT 04/04/2022 24  0 - 44 U/L Final  ? Alkaline Phosphatase 04/04/2022 25 (L)  38 - 126 U/L Final  ? Total Bilirubin 04/04/2022 1.0  0.3 - 1.2 mg/dL Final  ? GFR, Estimated  04/04/2022 >60  >60 mL/min Final  ? Comment: (NOTE) ?Calculated using the CKD-EPI Creatinine Equation (2021) ?  ? Anion gap 04/04/2022 13  5 - 15 Final  ? Performed at [facility], [address] [place]

## 2022-04-07 NOTE — ED Notes (Signed)
Pt A&O x3. Denies SI/HI/AVH this am. Denies withdrawal sx. No acute distress noted. Ate bkft without difficulty. Informed pt to notify staff with any needs or concerns. Will continue to monitor for safety. ?

## 2022-04-07 NOTE — ED Notes (Signed)
Pt outside with tech in the courtyard ?

## 2022-04-07 NOTE — ED Notes (Signed)
Pt sleeping@this time. Breathing even and unlabored. Will continue to monitor for safety 

## 2022-04-07 NOTE — ED Notes (Signed)
Patient eating lunch at this time.

## 2022-04-07 NOTE — ED Notes (Signed)
Pt came to [facility] and stated, "I need to let the doctor know that I am a sex offender. I need resources to help me get an ID and a job". NP notified. ?

## 2022-04-07 NOTE — ED Notes (Addendum)
Pt. Expressed he wants inpatient help and if he doesn't get it and gets put back out on the street and has plans to commit a crime so he can get shot by cop. He expressed that he would walk up to a cop and grab a gun so the cop has no choice but to shoot him. Or he would commit a crime  so he could get shot by a cop. He said he wanted "to go out with a bang so everyone knows who he is". He did however seem really interested in getting help but in the past he hasn't had any success and this will be his last chance. He said he has tried to kill himself three times on overdosing on drugs. This MHT sat down with him for 2 hours talking about ways to get back on his feet and we were here to help and that I was sorry it didn't work out for him in the past. While talking to him he seemed like he really wanted the help we have to offer and I told him to talk to the social worker when they come back to talk to him. He said he hasn't opened up to anyone because he doesn't want people to judge him. I told him he needs to tell them what hes told me so we can do the best to get him the help that he needs. He has said that he has done some really bad things in the past but that's not who he is now even tho he has MULTIPLE plans to commit crimes so he can get killed because he doesn't want to live this life no longer.  Pt. Also expressed if he went to a program he has no issue with doing drugs he just does them to kill himself he just want's help with his medication and staying on it. He knows he hears voices in his head and he wants them to go away but he doesn't think he can do it on his own.  ? ?

## 2022-04-07 NOTE — ED Notes (Signed)
Patient denies SI,HI,AVH. Patient is cooperative and interacts well with staff. Respiratory is even and unlabored. No distress noted. Patient is resting in bed at present. Patient stated no complaints at present. will continue to monitor for safety.  °

## 2022-04-07 NOTE — ED Notes (Signed)
Pt had snacks ?

## 2022-04-07 NOTE — ED Notes (Signed)
Pt ate additional meal and is now having a shower ?

## 2022-04-07 NOTE — ED Notes (Signed)
Pt eating dinner

## 2022-04-07 NOTE — ED Notes (Signed)
Pt sleeping in no acute distress. RR even and unlabored. Safety maintained. 

## 2022-04-07 NOTE — ED Notes (Signed)
No acute distress noted. Pt resting in bedroom. Will continue to monitor for safety. ?

## 2022-04-08 ENCOUNTER — Other Ambulatory Visit: Payer: Self-pay

## 2022-04-08 DIAGNOSIS — F1910 Other psychoactive substance abuse, uncomplicated: Secondary | ICD-10-CM | POA: Diagnosis not present

## 2022-04-08 DIAGNOSIS — R45851 Suicidal ideations: Secondary | ICD-10-CM | POA: Diagnosis not present

## 2022-04-08 DIAGNOSIS — Z9151 Personal history of suicidal behavior: Secondary | ICD-10-CM | POA: Diagnosis not present

## 2022-04-08 DIAGNOSIS — F250 Schizoaffective disorder, bipolar type: Secondary | ICD-10-CM | POA: Diagnosis not present

## 2022-04-08 NOTE — ED Notes (Signed)
Pt sleeping@this time. Breathing even and unlabored. Will continue to monitor for safety 

## 2022-04-08 NOTE — ED Notes (Signed)
Patient denies SI,HI,AVH. Patient is cooperative and interacts well with staff. Respiratory is even and unlabored. No distress noted. Patient is resting in bed at present. Patient stated no complaints at present. will continue to monitor for safety.  °

## 2022-04-08 NOTE — ED Notes (Signed)
Pt laying in bed resting in no acute distress. RR even and unlabored. Safety maintained. ?

## 2022-04-08 NOTE — ED Notes (Signed)
Pt is in the bed sleeping. Respirations are even and unlabored. No acute distress noted. Will continue to monitor for safety. 

## 2022-04-08 NOTE — ED Notes (Signed)
Pt sleeping in no acute distress. RR even and unlabored. Safety maintained. 

## 2022-04-08 NOTE — ED Notes (Signed)
Pt denies SI/HI/AVH. Reviewed medication (Zyprexa) with pt to include indication. Pt states, "it must be working". Praise given. Denies withdrawal sx. No acute distress noted. Observed watching staff suspiciously but denies concerns. Reassured pt of his safety. Informed pt to notify staff with any needs or concerns. Verbalized agreement. Will continue to monitor for safety. ?

## 2022-04-08 NOTE — ED Provider Notes (Addendum)
Behavioral Health Progress Note ? ?Date and Time: 04/08/2022 11:13 AM ?Name: Rickey Miller ?MRN:  [id number] ? ?Subjective:  ? ? Rickey Miller 31 y.o., male patient presented who initially who [facility] with SI with a plan of jumping off a bridge on 5/4. [facility] and [facility]. ? ?Rickey Miller, 31 y.o., male patient seen face to face by this provider, consulted with Dr. Gasper Sellsinderella ; and chart reviewed on 04/08/22.  Patient has a psychiatric history of schizoaffective disorder, bipolar disorder, polysubstance abuse, and multiple suicide attempts.  On admission his UDS is positive for amphetamines, cocaine, THC.  BAL less than 10.  He is currently prescribed Zyprexa 10 mg twice daily, Intuniv ER 1 mg daily, and Minipress 1 mg nightly.  He is tolerating medications without any adverse reactions. ? ?During evaluation Rickey Miller is sitting on the side of his bed.  He is alert/oriented.  He continues to have a flat blunted affect.  Reports he did not sleep well last night states he kept waking up.  He denies any concerns with appetite.  He continues to endorse depression and has a dysphoric affect. His PHQ9 score is 17 on today's assessment.  He endorses suicidal ideations on this assessment with a plan to jump off of a bridge states, "I feel like that is what I am going to do when I leave here".  He cannot contract for safety.  He is also endorsing homicidal ideations towards "anyone".  He denies any specific plan but states he could use, "any means possible".  He does not appear to be responding to internal/external stimuli.  He denies visual hallucinations.  He endorses auditory hallucinations states "they are more quiet today".  States that he hears a voice that is telling him to hurt himself and to hurt others.  He does not recognize this voice. ? ?Patient continues to meet inpatient psychiatric admission  criteria ? ? ?Diagnosis:  ?Final diagnoses:  ?Suicidal ideation  ?Substance abuse (HCC)  ?Schizoaffective disorder, bipolar type (HCC)  ? ? ?Total Time spent with patient: 30 minutes ? ?Past Psychiatric History: See H&P ?Past Medical History: No past medical history on file.  ?Past Surgical History:  ?Procedure Laterality Date  ? TONSILLECTOMY    ? ?Family History: No family history on file. ?Family Psychiatric  History: See H&P ?Social History:  ?Social History  ? ?Substance and Sexual Activity  ?Alcohol Use Yes  ? Comment: Friday, Sat  ?   ?Social History  ? ?Substance and Sexual Activity  ?Drug Use Yes  ? Types: Marijuana  ? Comment: weekly; unable to quantify  ?  ?Social History  ? ?Socioeconomic History  ? Marital status: Unknown  ?  Spouse name: Not on file  ? Number of children: Not on file  ? Years of education: Not on file  ? Highest education level: Not on file  ?Occupational History  ? Not on file  ?Tobacco Use  ? Smoking status: Every Day  ?  Packs/day: 1.00  ?  Years: 13.00  ?  Pack years: 13.00  ?  Types: Cigarettes  ? Smokeless tobacco: Never  ?Vaping Use  ? Vaping Use: Former  ?Substance and Sexual Activity  ? Alcohol use: Yes  ?  Comment: Friday, Sat  ? Drug use: Yes  ?  Types: Marijuana  ?  Comment: weekly; unable to quantify  ? Sexual activity: Yes  ?  Birth control/protection: None  ?Other Topics Concern  ? Not on file  ?Social History Narrative  ? Not on file  ? ?Social Determinants of Health  ? ?Financial Resource Strain: Not on file  ?Food Insecurity: Not on file  ?Transportation Needs: Not on file  ?Physical Activity: Not on file  ?Stress: Not on file  ?Social Connections: Not on file  ? ?SDOH:  ?SDOH Screenings  ? ?Alcohol Screen: Medium Risk  ? Last Alcohol Screening Score (AUDIT): 13  ?Depression (PHQ2-9): Medium Risk  ? PHQ-2 Score: 21  ?Financial Resource Strain: Not on file  ?Food Insecurity: Not on file  ?Housing: Not on file  ?Physical Activity: Not on file  ?Social Connections: Not  on file  ?Stress: Not on file  ?Tobacco Use: High Risk  ? Smoking Tobacco Use: Every Day  ? Smokeless Tobacco Use: Never  ? Passive Exposure: Not on file  ?Transportation Needs: Not on file  ? ?Additional Social History:  ?  ?  ?  ?  ?  ?  ?  ?  ?  ?  ?  ? ?Sleep: Fair ? ?Appetite:  Fair ? ?Current Medications:  ?Current Facility-Administered Medications  ?Medication Dose Route Frequency Provider Last Rate Last Admin  ? acetaminophen (TYLENOL) tablet 650 mg  650 mg Oral Q6H PRN Sindy Guadeloupe, NP   650 mg at 04/05/22 1905  ? alum & mag hydroxide-simeth (MAALOX/MYLANTA) 200-200-20 MG/5ML suspension 30 mL  30 mL Oral Q4H PRN Sindy Guadeloupe, NP      ? aspirin tablet 325 mg  325 mg Oral BID Nira Conn A, NP   325 mg at 04/08/22 4818  ? dicyclomine (BENTYL) tablet 20 mg  20 mg Oral Q6H PRN Sindy Guadeloupe, NP      ? guanFACINE (INTUNIV) ER tablet 1 mg  1 mg Oral Daily Estella Husk, MD   1 mg at 04/08/22 5631  ? hydrOXYzine (ATARAX) tablet 25 mg  25 mg Oral TID PRN Sindy Guadeloupe, NP   25 mg at 04/07/22 2101  ? loperamide (IMODIUM) capsule 2-4 mg  2-4 mg Oral PRN Sindy Guadeloupe, NP      ? magnesium hydroxide (MILK OF MAGNESIA) suspension 30 mL  30 mL Oral Daily PRN Sindy Guadeloupe, NP      ? methocarbamol (ROBAXIN) tablet 500 mg  500 mg Oral Q8H PRN Sindy Guadeloupe, NP      ? naproxen (NAPROSYN) tablet 500 mg  500 mg Oral BID PRN Sindy Guadeloupe, NP      ? OLANZapine (ZYPREXA) tablet 10 mg  10 mg Oral BID Estella Husk, MD   10 mg at 04/08/22 4970  ? ondansetron (ZOFRAN-ODT) disintegrating tablet 4 mg  4 mg Oral Q6H PRN Sindy Guadeloupe, NP      ? pantoprazole (PROTONIX) EC tablet 20 mg  20 mg Oral Daily Nira Conn A, NP   20 mg at 04/08/22 2637  ? prazosin (MINIPRESS) capsule 1 mg  1 mg Oral QHS Estella Husk, MD   1 mg at 04/05/22 2109  ? traZODone (DESYREL) tablet 50 mg  50 mg Oral QHS PRN Estella Husk, MD   50 mg at 04/07/22 2101  ? ?Current Outpatient Medications  ?Medication Sig Dispense Refill  ?  guanFACINE (INTUNIV) 1 MG TB24 ER tablet Take 1 tablet (1 mg total) by mouth daily. (Patient not taking: Reported on 04/04/2022) 30 tablet 0  ? hydrOXYzine (ATARAX) 25 MG tablet Take 1 tablet (25 mg total) by mouth  3 (three) times daily as needed for anxiety. 30 tablet 0  ? nicotine polacrilex (NICORETTE) 2 MG gum Take 1 each (2 mg total) by mouth as needed for smoking cessation. (Patient not taking: Reported on 04/04/2022) 100 tablet 0  ? OLANZapine (ZYPREXA) 10 MG tablet Take 1 tablet (10 mg total) by mouth at bedtime. (Patient not taking: Reported on 04/04/2022) 30 tablet 0  ? OLANZapine (ZYPREXA) 5 MG tablet Take 1 tablet (5 mg total) by mouth daily. (Patient not taking: Reported on 04/04/2022) 30 tablet 0  ? prazosin (MINIPRESS) 1 MG capsule Take 1 capsule (1 mg total) by mouth at bedtime. 30 capsule 0  ? sertraline (ZOLOFT) 50 MG tablet Take 50 mg by mouth daily.    ? thiamine 100 MG tablet Take 1 tablet (100 mg total) by mouth daily. (Patient not taking: Reported on 04/04/2022) 30 tablet 0  ? traZODone (DESYREL) 50 MG tablet Take 1 tablet (50 mg total) by mouth at bedtime as needed for sleep. (Patient not taking: Reported on 04/04/2022) 30 tablet 0  ? ? ?Labs  ?Lab Results:  ?Admission on 04/04/2022, Discharged on 04/05/2022  ?Component Date Value Ref Range Status  ? Sodium 04/04/2022 139  135 - 145 mmol/L Final  ? Potassium 04/04/2022 3.3 (L)  3.5 - 5.1 mmol/L Final  ? Chloride 04/04/2022 105  98 - 111 mmol/L Final  ? CO2 04/04/2022 21 (L)  22 - 32 mmol/L Final  ? Glucose, Bld 04/04/2022 82  70 - 99 mg/dL Final  ? Glucose reference range applies only to samples taken after fasting for at least 8 hours.  ? BUN 04/04/2022 7  6 - 20 mg/dL Final  ? Creatinine, Ser 04/04/2022 0.95  0.61 - 1.24 mg/dL Final  ? Calcium 96/29/5284 9.4  8.9 - 10.3 mg/dL Final  ? Total Protein 04/04/2022 7.4  6.5 - 8.1 g/dL Final  ? Albumin 13/24/4010 4.5  3.5 - 5.0 g/dL Final  ? AST 27/25/3664 38  15 - 41 U/L Final  ? ALT 04/04/2022 24  0 - 44  U/L Final  ? Alkaline Phosphatase 04/04/2022 25 (L)  38 - 126 U/L Final  ? Total Bilirubin 04/04/2022 1.0  0.3 - 1.2 mg/dL Final  ? GFR, Estimated 04/04/2022 >60  >60 mL/min Final  ? Comment: (NOTE) ?Calculated using t

## 2022-04-08 NOTE — ED Notes (Signed)
Gave Pt. A snack 

## 2022-04-09 ENCOUNTER — Encounter (HOSPITAL_COMMUNITY): Payer: Self-pay | Admitting: Psychiatry

## 2022-04-09 ENCOUNTER — Inpatient Hospital Stay (HOSPITAL_COMMUNITY)
Admission: AD | Admit: 2022-04-09 | Discharge: 2022-04-13 | DRG: 885 | Disposition: A | Discharge status: Home / Self Care | Payer: Federal, State, Local not specified - Other | Source: Intra-hospital | Attending: Emergency Medicine | Admitting: Emergency Medicine

## 2022-04-09 ENCOUNTER — Other Ambulatory Visit: Payer: Self-pay

## 2022-04-09 DIAGNOSIS — K30 Functional dyspepsia: Secondary | ICD-10-CM | POA: Diagnosis present

## 2022-04-09 DIAGNOSIS — F17210 Nicotine dependence, cigarettes, uncomplicated: Secondary | ICD-10-CM | POA: Diagnosis present

## 2022-04-09 DIAGNOSIS — G4700 Insomnia, unspecified: Secondary | ICD-10-CM | POA: Diagnosis present

## 2022-04-09 DIAGNOSIS — F411 Generalized anxiety disorder: Secondary | ICD-10-CM | POA: Diagnosis present

## 2022-04-09 DIAGNOSIS — Z62810 Personal history of physical and sexual abuse in childhood: Secondary | ICD-10-CM | POA: Diagnosis present

## 2022-04-09 DIAGNOSIS — F10139 Alcohol abuse with withdrawal, unspecified: Secondary | ICD-10-CM | POA: Diagnosis present

## 2022-04-09 DIAGNOSIS — F1910 Other psychoactive substance abuse, uncomplicated: Secondary | ICD-10-CM | POA: Diagnosis present

## 2022-04-09 DIAGNOSIS — F1110 Opioid abuse, uncomplicated: Secondary | ICD-10-CM | POA: Diagnosis present

## 2022-04-09 DIAGNOSIS — F250 Schizoaffective disorder, bipolar type: Principal | ICD-10-CM | POA: Diagnosis present

## 2022-04-09 DIAGNOSIS — Z62811 Personal history of psychological abuse in childhood: Secondary | ICD-10-CM | POA: Diagnosis present

## 2022-04-09 DIAGNOSIS — Z8249 Family history of ischemic heart disease and other diseases of the circulatory system: Secondary | ICD-10-CM

## 2022-04-09 DIAGNOSIS — R45851 Suicidal ideations: Secondary | ICD-10-CM | POA: Diagnosis not present

## 2022-04-09 DIAGNOSIS — F1410 Cocaine abuse, uncomplicated: Secondary | ICD-10-CM | POA: Diagnosis present

## 2022-04-09 DIAGNOSIS — Z79899 Other long term (current) drug therapy: Secondary | ICD-10-CM

## 2022-04-09 DIAGNOSIS — K5900 Constipation, unspecified: Secondary | ICD-10-CM | POA: Diagnosis present

## 2022-04-09 DIAGNOSIS — E876 Hypokalemia: Secondary | ICD-10-CM | POA: Diagnosis present

## 2022-04-09 DIAGNOSIS — F4310 Post-traumatic stress disorder, unspecified: Secondary | ICD-10-CM | POA: Diagnosis present

## 2022-04-09 DIAGNOSIS — F259 Schizoaffective disorder, unspecified: Principal | ICD-10-CM

## 2022-04-09 DIAGNOSIS — T50902A Poisoning by unspecified drugs, medicaments and biological substances, intentional self-harm, initial encounter: Secondary | ICD-10-CM

## 2022-04-09 DIAGNOSIS — Z5902 Unsheltered homelessness: Secondary | ICD-10-CM

## 2022-04-09 DIAGNOSIS — F1510 Other stimulant abuse, uncomplicated: Secondary | ICD-10-CM | POA: Diagnosis present

## 2022-04-09 DIAGNOSIS — Z634 Disappearance and death of family member: Secondary | ICD-10-CM

## 2022-04-09 DIAGNOSIS — F17200 Nicotine dependence, unspecified, uncomplicated: Secondary | ICD-10-CM | POA: Diagnosis present

## 2022-04-09 DIAGNOSIS — F1210 Cannabis abuse, uncomplicated: Secondary | ICD-10-CM | POA: Diagnosis present

## 2022-04-09 DIAGNOSIS — I4510 Unspecified right bundle-branch block: Secondary | ICD-10-CM | POA: Diagnosis present

## 2022-04-09 DIAGNOSIS — F4010 Social phobia, unspecified: Secondary | ICD-10-CM | POA: Diagnosis present

## 2022-04-09 DIAGNOSIS — Z833 Family history of diabetes mellitus: Secondary | ICD-10-CM

## 2022-04-09 DIAGNOSIS — F1090 Alcohol use, unspecified, uncomplicated: Secondary | ICD-10-CM | POA: Diagnosis present

## 2022-04-09 DIAGNOSIS — Z9151 Personal history of suicidal behavior: Secondary | ICD-10-CM | POA: Diagnosis not present

## 2022-04-09 LAB — RAPID URINE DRUG SCREEN, HOSP PERFORMED
Amphetamines: NOT DETECTED
Barbiturates: NOT DETECTED
Benzodiazepines: NOT DETECTED
Cocaine: NOT DETECTED
Opiates: NOT DETECTED
Tetrahydrocannabinol: POSITIVE — AB

## 2022-04-09 LAB — POC SARS CORONAVIRUS 2 AG: SARSCOV2ONAVIRUS 2 AG: NEGATIVE

## 2022-04-09 MED ORDER — HYDROXYZINE HCL 25 MG PO TABS: 25.0000 mg | ORAL_TABLET | Freq: Three times a day (TID) | ORAL | Status: DC | PRN

## 2022-04-09 MED ORDER — TRAZODONE HCL 50 MG PO TABS
50.0000 mg | ORAL_TABLET | Freq: Every evening | ORAL | Status: DC | PRN
  Administered 2022-04-10: 50 mg via ORAL
  Filled 2022-04-09: qty 1

## 2022-04-09 MED ORDER — LORAZEPAM 1 MG PO TABS
1.0000 mg | ORAL_TABLET | Freq: Three times a day (TID) | ORAL | Status: DC | PRN
  Filled 2022-04-09: qty 1

## 2022-04-09 MED ORDER — OLANZAPINE 10 MG PO TBDP
10.0000 mg | ORAL_TABLET | Freq: Three times a day (TID) | ORAL | Status: DC | PRN
  Administered 2022-04-09: 10 mg via ORAL
  Filled 2022-04-09 (×2): qty 1

## 2022-04-09 MED ORDER — GUANFACINE HCL ER 1 MG PO TB24
1.0000 mg | ORAL_TABLET | Freq: Every day | ORAL | Status: DC
  Administered 2022-04-10: 1 mg via ORAL
  Filled 2022-04-09 (×3): qty 1

## 2022-04-09 MED ORDER — ACETAMINOPHEN 325 MG PO TABS: 650.0000 mg | ORAL_TABLET | Freq: Four times a day (QID) | ORAL | Status: DC | PRN

## 2022-04-09 MED ORDER — LORAZEPAM 2 MG/ML IJ SOLN
INTRAMUSCULAR | Status: AC
  Filled 2022-04-09: qty 1

## 2022-04-09 MED ORDER — OLANZAPINE 10 MG PO TABS
10.0000 mg | ORAL_TABLET | Freq: Two times a day (BID) | ORAL | Status: DC
Start: 2022-04-09 — End: 2022-04-10
  Administered 2022-04-09 – 2022-04-10 (×2): 10 mg via ORAL
  Filled 2022-04-09 (×6): qty 1

## 2022-04-09 MED ORDER — NICOTINE POLACRILEX 2 MG MT GUM: 2.0000 mg | CHEWING_GUM | OROMUCOSAL | Status: DC | PRN

## 2022-04-09 MED ORDER — OLANZAPINE 10 MG PO TABS
10.0000 mg | ORAL_TABLET | Freq: Two times a day (BID) | ORAL | Status: DC
  Filled 2022-04-09 (×2): qty 1

## 2022-04-09 MED ORDER — PRAZOSIN HCL 1 MG PO CAPS
1.0000 mg | ORAL_CAPSULE | Freq: Every day | ORAL | Status: DC
  Administered 2022-04-09: 1 mg via ORAL
  Filled 2022-04-09 (×5): qty 1

## 2022-04-09 MED ORDER — MAGNESIUM HYDROXIDE 400 MG/5ML PO SUSP: 30.0000 mL | Freq: Every day | ORAL | Status: DC | PRN

## 2022-04-09 MED ORDER — ZIPRASIDONE MESYLATE 20 MG IM SOLR
20.0000 mg | Freq: Three times a day (TID) | INTRAMUSCULAR | Status: DC | PRN
  Filled 2022-04-09: qty 20

## 2022-04-09 MED ORDER — OLANZAPINE 10 MG PO TABS
10.0000 mg | ORAL_TABLET | Freq: Two times a day (BID) | ORAL | Status: DC
Start: 2022-04-09 — End: 2022-04-13

## 2022-04-09 MED ORDER — THIAMINE HCL 100 MG PO TABS
100.0000 mg | ORAL_TABLET | Freq: Every day | ORAL | Status: DC
  Administered 2022-04-10 – 2022-04-13 (×4): 100 mg via ORAL
  Filled 2022-04-09 (×6): qty 1

## 2022-04-09 MED ORDER — ALUM & MAG HYDROXIDE-SIMETH 200-200-20 MG/5ML PO SUSP: 30.0000 mL | ORAL | Status: DC | PRN

## 2022-04-09 NOTE — Progress Notes (Signed)
Patient ID: Rickey Miller, male   DOB: 06-Oct-1991, 31 y.o.   MRN: [id number] ?1600: Patient presented voluntarily from [facility]. Per provider, while patient was at [facility], he was pleasant and cooperative, taking medications voluntarily. When he arrived here, patient reported that he had changed his mind about being admitted, refusing to come to the unit. Patient remained resistant for a while but ended up contracting for 72hr at 1620. Patient came to the unit and remained guarded for a little while. Patient reported that he has been homeless since November 2022 when he was released from prison. He has no family around (mother is deceased and brother not in contact). Patient has not been able to hold a job "due to my anger, my fighting...". Reports that he was not taking medications prior to admission. He has just reported that he is feeling agitated and requested medication. Zyprexa 10 mg given. Patient denies SI/HI/AVH. No additional concerns. Patient  is currently eating dinner and has no visible sign of distress. Safety precautions initiated.  ?

## 2022-04-09 NOTE — ED Notes (Signed)
Pt is in the bed sleeping. Respirations are even and unlabored. No acute distress noted. Will continue to monitor for safety, ?

## 2022-04-09 NOTE — ED Notes (Signed)
Patient is calm and quiet on unit.  He remains somewhat guarded with slight paranoia.  Patient tends to remain isolative in his room.  No complaints or somatic distress at this time.  No withdrawal.  No agitation and he is able to make needs known to staff without aggression.  Will monitor and provide safe environment.   ?

## 2022-04-09 NOTE — Discharge Instructions (Signed)
Transfer to [facility] 

## 2022-04-09 NOTE — Group Note (Signed)
Due to social work team being short staffed and high level of admissions, social work group unable to be held. ? ? ?Rickey Fennewald, LCSW, LCAS ?Clincal Social Worker  ?[facility] ?

## 2022-04-09 NOTE — ED Notes (Signed)
Patient wanted to go over the booklet I made for them, Titled Wellness, it had 8 Dimensions of Wellness along with worksheets, it also covered Nutritional Wellness and the connection between brain and food and how food affects the way you feel. Self-Care and Healthy Sleeping Habits along with worksheets. ?

## 2022-04-09 NOTE — ED Notes (Signed)
Print production planner. Left voicemail to have a call returned for transportation.  ?

## 2022-04-09 NOTE — ED Notes (Signed)
Report called to Elizabeth RN at [facility]. 

## 2022-04-09 NOTE — ED Notes (Signed)
Repeat POC test performed, negative results. Pt tolerated well.  ?

## 2022-04-09 NOTE — ED Provider Notes (Signed)
[facility] ASAP Discharge Summary ? ?Date and Time: 04/09/2022 10:47 AM  ?Name: Rickey Miller  ?MRN:  [id number]  ? ?Discharge Diagnoses:  ?Final diagnoses:  ?Suicidal ideation  ?Substance abuse ([place])  ?Schizoaffective disorder, bipolar type ([place])  ? ? ?Subjective:  ?Patient seen and chart reviewed- patient has been medication compliant and has beeen appropriate with staff and peers on the unit. Patient has been isolative to his room. Patient interviewed in his room this morning, he is found laying in bed in NAD. He describes his mood as "alright"; however, continues to report SI with a plan. He denies issues with appetite and describes having difficulty with sleep. He states that he slept ~ 5 hours. Patient continues to report AH, states that he last experienced them yesterday; declines to discuss the content of the AH. Patient does appear distracted at times. He reports HI "if I get the chance to"; however, is unable to provide further details. Discussed with patient that he has been recommended for higher level of care since Friday but that placement was not found-informed patient that he has been accepted to [facility] later today. Patient verbalized understanding and was in agreement. ? ?Stay Summary:  ? Rickey Miller 31 y.o., male patient presented who initially who presented to the [facility] ED with SI with a plan of jumping off a bridge on 5/4. Patient was transferred to the [facility] on 5/5 and then admitted to the [facility] for further treatment. UDS is positive for amphetamines, cocaine, THC. He was restarted on previous home medications of olanzapine 5 mg/10 mg, intuniv 1 mg daily and prazodin 1 mg qhs and aspirin and warm compress for phlebitis. On 5/5, patient reported worsening of AH and olanzapine was increased to 10 mg BID. Patient tolerated this medication increase without issue. From 5/5 to 5/8 patient continued to report psychotic symptoms of AH as well as SI with a plan despite medication and [facility] groups and  patient was referred due to  needing a higher level of care. Patient was accepted to [facility] on 5/8.  Patient continued to report AH and SI at time of transfer  ? ?Total Time spent with patient: 20 minutes ? ?Past Psychiatric History: Schizoaffective disorder; polysubstance abuse; bipolar disorder ?Past Medical History: No past medical history on file.  ?Past Surgical History:  ?Procedure Laterality Date  ? TONSILLECTOMY    ? ?Family History: No family history on file. ?Family Psychiatric History: Brother-?Bipolar schizophrenia ?Social History:  ?Social History  ? ?Substance and Sexual Activity  ?Alcohol Use Yes  ? Comment: Friday, Sat  ?   ?Social History  ? ?Substance and Sexual Activity  ?Drug Use Yes  ? Types: Marijuana  ? Comment: weekly; unable to quantify  ?  ?Social History  ? ?Socioeconomic History  ? Marital status: Unknown  ?  Spouse name: Not on file  ? Number of children: Not on file  ? Years of education: Not on file  ? Highest education level: Not on file  ?Occupational History  ? Not on file  ?Tobacco Use  ? Smoking status: Every Day  ?  Packs/day: 1.00  ?  Years: 13.00  ?  Pack years: 13.00  ?  Types: Cigarettes  ? Smokeless tobacco: Never  ?Vaping Use  ? Vaping Use: Former  ?Substance and Sexual Activity  ? Alcohol use: Yes  ?  Comment: Friday, Sat  ? Drug use: Yes  ?  Types: Marijuana  ?  Comment: weekly; unable to quantify  ?  Sexual activity: Yes  ?  Birth control/protection: None  ?Other Topics Concern  ? Not on file  ?Social History Narrative  ? Not on file  ? ?Social Determinants of Health  ? ?Financial Resource Strain: Not on file  ?Food Insecurity: Not on file  ?Transportation Needs: Not on file  ?Physical Activity: Not on file  ?Stress: Not on file  ?Social Connections: Not on file  ? ?SDOH:  ?SDOH Screenings  ? ?Alcohol Screen: Medium Risk  ? Last Alcohol Screening Score (AUDIT): 13  ?Depression (PHQ2-9): Medium Risk  ? PHQ-2 Score: 17  ?Financial Resource Strain: Not on file  ?Food  Insecurity: Not on file  ?Housing: Not on file  ?Physical Activity: Not on file  ?Social Connections: Not on file  ?Stress: Not on file  ?Tobacco Use: High Risk  ? Smoking Tobacco Use: Every Day  ? Smokeless Tobacco Use: Never  ? Passive Exposure: Not on file  ?Transportation Needs: Not on file  ? ? ?Tobacco Cessation:  N/A, patient does not currently use tobacco products ? ?Current Medications:  ?Current Facility-Administered Medications  ?Medication Dose Route Frequency Provider Last Rate Last Admin  ? acetaminophen (TYLENOL) tablet 650 mg  650 mg Oral Q6H PRN Evette Georges, NP   650 mg at 04/05/22 1905  ? alum & mag hydroxide-simeth (MAALOX/MYLANTA) 200-200-20 MG/5ML suspension 30 mL  30 mL Oral Q4H PRN Evette Georges, NP      ? aspirin tablet 325 mg  325 mg Oral BID Lindon Romp A, NP   325 mg at 04/09/22 1006  ? dicyclomine (BENTYL) tablet 20 mg  20 mg Oral Q6H PRN Evette Georges, NP      ? guanFACINE (INTUNIV) ER tablet 1 mg  1 mg Oral Daily Ival Bible, MD   1 mg at 04/09/22 1007  ? hydrOXYzine (ATARAX) tablet 25 mg  25 mg Oral TID PRN Evette Georges, NP   25 mg at 04/07/22 2101  ? loperamide (IMODIUM) capsule 2-4 mg  2-4 mg Oral PRN Evette Georges, NP      ? magnesium hydroxide (MILK OF MAGNESIA) suspension 30 mL  30 mL Oral Daily PRN Evette Georges, NP      ? methocarbamol (ROBAXIN) tablet 500 mg  500 mg Oral Q8H PRN Evette Georges, NP      ? naproxen (NAPROSYN) tablet 500 mg  500 mg Oral BID PRN Evette Georges, NP      ? OLANZapine (ZYPREXA) tablet 10 mg  10 mg Oral BID Ival Bible, MD   10 mg at 04/09/22 1007  ? ondansetron (ZOFRAN-ODT) disintegrating tablet 4 mg  4 mg Oral Q6H PRN Evette Georges, NP      ? pantoprazole (PROTONIX) EC tablet 20 mg  20 mg Oral Daily Lindon Romp A, NP   20 mg at 04/09/22 1007  ? prazosin (MINIPRESS) capsule 1 mg  1 mg Oral QHS Ival Bible, MD   1 mg at 04/05/22 2109  ? traZODone (DESYREL) tablet 50 mg  50 mg Oral QHS PRN Ival Bible, MD   50 mg  at 04/07/22 2101  ? ?Current Outpatient Medications  ?Medication Sig Dispense Refill  ? guanFACINE (INTUNIV) 1 MG TB24 ER tablet Take 1 tablet (1 mg total) by mouth daily. (Patient not taking: Reported on 04/04/2022) 30 tablet 0  ? hydrOXYzine (ATARAX) 25 MG tablet Take 1 tablet (25 mg total) by mouth 3 (three) times daily as needed for anxiety. 30 tablet 0  ? nicotine polacrilex (  NICORETTE) 2 MG gum Take 1 each (2 mg total) by mouth as needed for smoking cessation. (Patient not taking: Reported on 04/04/2022) 100 tablet 0  ? OLANZapine (ZYPREXA) 10 MG tablet Take 1 tablet (10 mg total) by mouth at bedtime. (Patient not taking: Reported on 04/04/2022) 30 tablet 0  ? OLANZapine (ZYPREXA) 5 MG tablet Take 1 tablet (5 mg total) by mouth daily. (Patient not taking: Reported on 04/04/2022) 30 tablet 0  ? prazosin (MINIPRESS) 1 MG capsule Take 1 capsule (1 mg total) by mouth at bedtime. 30 capsule 0  ? sertraline (ZOLOFT) 50 MG tablet Take 50 mg by mouth daily.    ? thiamine 100 MG tablet Take 1 tablet (100 mg total) by mouth daily. (Patient not taking: Reported on 04/04/2022) 30 tablet 0  ? traZODone (DESYREL) 50 MG tablet Take 1 tablet (50 mg total) by mouth at bedtime as needed for sleep. (Patient not taking: Reported on 04/04/2022) 30 tablet 0  ? ? ?PTA Medications: (Not in a hospital admission) ? ? ?Musculoskeletal  ?Strength & Muscle Tone: within normal limits ?Gait & Station: normal ?Patient leans: N/A ? ?Psychiatric Specialty Exam  ?Presentation  ?General Appearance: Casual ? ?Eye Contact:Fair ? ?Speech:Clear and Coherent; Normal Rate ? ?Speech Volume:Normal ? ?Handedness:Right ? ? ?Mood and Affect  ?Mood:Dysphoric ? ?Affect:Congruent ? ? ?Thought Process  ?Thought Processes:Coherent ? ?Descriptions of Associations:Intact ? ?Orientation:Full (Time, Place and Person) ? ?Thought Content:Logical ? Diagnosis of Schizophrenia or Schizoaffective disorder in past: Yes ? Duration of Psychotic Symptoms: Greater than six months ? ?  Hallucinations:Hallucinations: Auditory ?Description of Auditory Hallucinations: voice that is telling him to hurt others. ? ?Ideas of Reference:None ? ?Suicidal Thoughts:Suicidal Thoughts: Yes, Active ?S

## 2022-04-09 NOTE — Progress Notes (Deleted)
Patient has been denied by [facility] due to no appropriate beds available. Patient meets BH inpatient criteria per Vernard Gambles, NP. Patient has been faxed out to the following facilities:  ? ?[facility]  [address] [place] [address] [phone]  ?[facility]  [address] [place], [place] [address] [phone]  ?[facility]  [address] [place], [place] [address] [phone]  ?[facility]  [address] [place], [place] [address] [phone]  ?[facility]  [address], [place] [address] [phone]  ?[facility]  [address] [place] [address] [phone]  ?[facility]  [address] [place] [address] [phone]  ?[facility]  [address] [place] [address] [phone]  ?[facility]  [address] [place] [address] [phone]  ?[facility]  [address] [place] [address] [phone]  ?[facility]  [address] [place] [address] [phone]  ?[facility]  [address] [place] [address] [phone]  ?[facility]  [address] [place] [address] [phone]  ?[facility] [place] [address] [phone]  ?[facility]  [address], [place] [address] [phone]  ?[facility]  [address] [place] [address] [phone]  ?[facility]  [address] [place] [address] [phone]  ?[facility]  [address] [place] [address] [phone]  ?[facility]  [address], [place] [address] [phone]  ?[facility]  [address] [place], [place] [address] [phone]  ? ?Damita Dunnings, MSW, LCSW-A  ?1:36 PM 04/09/2022   ?

## 2022-04-09 NOTE — Progress Notes (Signed)
[facility] LCSW Progress Note ?  ?04/09/2022    1:42 PM ? ?Rickey Miller  ? ?[id number]  ? ?Type of Contact and Topic:  Psychiatric Bed Placement  ? ?Pt accepted to [facility]   ? ?Patient meets inpatient criteria per Vernard Gambles, NP ? ?The attending provider will be Massengill, MD  ? ?Call report to [phone]   ? ?Laretta Alstrom, RN @ [facility] notified.    ? ?Pt scheduled  to arrive at [facility] TODAY @ 1500.  ? ?Damita Dunnings, MSW, LCSW-A  ?1:42 PM 04/09/2022   ?  ? ?  ?  ? ? ? ? ?  ?

## 2022-04-09 NOTE — Plan of Care (Signed)
Patient has been calmer and more understanding for the last hour. Went to [employer] and remained calm and cooperative. Expressed multiple triggers including homelessness, lack of support, and anger problem. Admitted that he has not been taking medications. Patient is willing to work on himself "so that I can leave".  ?

## 2022-04-09 NOTE — Tx Team (Signed)
Initial Treatment Plan ?04/09/2022 ?6:04 PM ?Rickey Miller ?[id number] ? ? ? ?PATIENT STRESSORS: ?Medication change or noncompliance   ?Substance abuse   ?Other: housing   ? ? ?PATIENT STRENGTHS: ?Ability for insight  ?Communication skills  ?Physical Health  ? ? ?PATIENT IDENTIFIED PROBLEMS: ?Homeless  ?depressed  ?Substance use  ?Suicidal ideations  ?  ?  ?  ?  ?  ?  ? ?DISCHARGE CRITERIA:  ?Ability to meet basic life and health needs ?Improved stabilization in mood, thinking, and/or behavior ?Verbal commitment to aftercare and medication compliance ? ?PRELIMINARY DISCHARGE PLAN: ?Outpatient therapy ?Placement in alternative living arrangements ? ?PATIENT/FAMILY INVOLVEMENT: ?This treatment plan has been presented to and reviewed with the patient, [name].  The patient has  been given the opportunity to ask questions and make suggestions. ? ?Ronelle Nigh, RN ?04/09/2022, 6:04 PM ?

## 2022-04-09 NOTE — Plan of Care (Signed)
Patient is alert and oriented x4. He denies SI/HI/AVH at this time.  Patient sitting on his bed.  He is calm and cooperative with this Probation officer.He denies any anxiety or depression. He states he was feeling little better about this admission.  His affect is flat and he appears to be sad. He is medication complaint. He approach this Probation officer inquiring about his medications.  Pt denies any pain or discomfort.  Will continue to monitor ?

## 2022-04-09 NOTE — Progress Notes (Signed)
Adult Psychoeducational Group Note ? ?Date:  04/09/2022 ?Time:  8:46 PM ? ?Group Topic/Focus:  ?Wrap-Up Group:   The focus of this group is to help patients review their daily goal of treatment and discuss progress on daily workbooks. ? ?Participation Level:  Active ? ?Participation Quality:  Appropriate ? ?Affect:  Appropriate ? ?Cognitive:  Appropriate ? ?Insight: Appropriate ? ?Engagement in Group:  Engaged ? ?Modes of Intervention:  Discussion ? ?Additional Comments:  Pt stated his goal for today was to focus on his treatment plan. Pt stated he accomplished his goal today. Pt stated he talked with his doctor and social worker about his care today. Pt rated his overall day a 6 out of 10. Pt stated he made no calls today. Pt stated he felt better about himself today. Pt stated he was able to attend all meals. Pt stated he took all medications provided today. Pt stated he attend all groups held today. Pt stated his appetite was pretty good today. Pt rated sleep last night was pretty good. Pt stated the goal tonight was to get some rest. Pt stated he had no physical pain tonight. Pt deny visual hallucinations and auditory issues tonight. Pt denies thoughts of harming himself or others. Pt stated he would alert staff if anything changed. ? ?Rickey Miller ?04/09/2022, 8:46 PM ?

## 2022-04-09 NOTE — Progress Notes (Signed)
Patient reports he has been homeless since November 2022 when he was released from prison. He is from [place]. Mother is deceased. Brothers not supportive.  ?

## 2022-04-09 NOTE — ED Notes (Signed)
[facility] contacted  

## 2022-04-10 DIAGNOSIS — F259 Schizoaffective disorder, unspecified: Secondary | ICD-10-CM

## 2022-04-10 DIAGNOSIS — F1510 Other stimulant abuse, uncomplicated: Secondary | ICD-10-CM | POA: Diagnosis present

## 2022-04-10 DIAGNOSIS — F4310 Post-traumatic stress disorder, unspecified: Secondary | ICD-10-CM

## 2022-04-10 DIAGNOSIS — F1110 Opioid abuse, uncomplicated: Secondary | ICD-10-CM | POA: Diagnosis present

## 2022-04-10 MED ORDER — ONDANSETRON 4 MG PO TBDP: 4.0000 mg | ORAL_TABLET | Freq: Four times a day (QID) | ORAL | Status: DC | PRN

## 2022-04-10 MED ORDER — LORAZEPAM 1 MG PO TABS: 1.0000 mg | ORAL_TABLET | Freq: Four times a day (QID) | ORAL | Status: DC | PRN

## 2022-04-10 MED ORDER — NAPROXEN 500 MG PO TABS: 500.0000 mg | ORAL_TABLET | Freq: Two times a day (BID) | ORAL | Status: DC | PRN

## 2022-04-10 MED ORDER — ADULT MULTIVITAMIN W/MINERALS CH
1.0000 | ORAL_TABLET | Freq: Every day | ORAL | Status: DC
  Administered 2022-04-11 – 2022-04-13 (×3): 1 via ORAL
  Filled 2022-04-10 (×6): qty 1

## 2022-04-10 MED ORDER — TRAZODONE HCL 50 MG PO TABS
50.0000 mg | ORAL_TABLET | Freq: Once | ORAL | Status: DC
  Filled 2022-04-10: qty 1

## 2022-04-10 MED ORDER — HALOPERIDOL 5 MG PO TABS
5.0000 mg | ORAL_TABLET | Freq: Two times a day (BID) | ORAL | Status: DC
Start: 2022-04-10 — End: 2022-04-13
  Administered 2022-04-10 – 2022-04-13 (×6): 5 mg via ORAL
  Filled 2022-04-10: qty 14
  Filled 2022-04-10 (×7): qty 1
  Filled 2022-04-10: qty 14
  Filled 2022-04-10: qty 1

## 2022-04-10 MED ORDER — BENZTROPINE MESYLATE 1 MG PO TABS
1.0000 mg | ORAL_TABLET | Freq: Two times a day (BID) | ORAL | Status: DC | PRN
  Filled 2022-04-10: qty 14

## 2022-04-10 MED ORDER — OLANZAPINE 5 MG PO TABS
5.0000 mg | ORAL_TABLET | Freq: Two times a day (BID) | ORAL | Status: DC
  Administered 2022-04-10 – 2022-04-11 (×2): 5 mg via ORAL
  Filled 2022-04-10 (×6): qty 1

## 2022-04-10 MED ORDER — METHOCARBAMOL 500 MG PO TABS: 500.0000 mg | ORAL_TABLET | Freq: Three times a day (TID) | ORAL | Status: DC | PRN

## 2022-04-10 MED ORDER — HYDROXYZINE HCL 25 MG PO TABS
25.0000 mg | ORAL_TABLET | Freq: Four times a day (QID) | ORAL | Status: DC | PRN
  Administered 2022-04-10 – 2022-04-11 (×2): 25 mg via ORAL
  Filled 2022-04-10 (×2): qty 1

## 2022-04-10 MED ORDER — DICYCLOMINE HCL 20 MG PO TABS: 20.0000 mg | ORAL_TABLET | Freq: Four times a day (QID) | ORAL | Status: DC | PRN

## 2022-04-10 MED ORDER — PANTOPRAZOLE SODIUM 40 MG PO TBEC
40.0000 mg | DELAYED_RELEASE_TABLET | Freq: Every day | ORAL | Status: DC
  Administered 2022-04-11: 40 mg via ORAL
  Filled 2022-04-10 (×6): qty 1

## 2022-04-10 MED ORDER — LOPERAMIDE HCL 2 MG PO CAPS: 2.0000 mg | ORAL_CAPSULE | ORAL | Status: DC | PRN
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
## 2022-04-10 NOTE — Group Note (Signed)
Recreation Therapy Group Note ? ? ?Group Topic:Personal Development  ?Group Date: 04/10/2022 ?Start Time: 1000 ?End Time: 1040 ?Facilitators: Caroll Rancher, LRT,CTRS ?Location: [address] Dayroom ? ? ?Goal Area(s) Addresses: ?Patient will successfully practice self-awareness and reflect on current values, lifestyle, and habits.   ?Patient will identify how skills learned during activity can be used to reach post d/c goals and make healthy changes.   ? ?Group Description: My DBT House. LRT and patients held a group discussion on behavioral expectations and group topic promoting self-awareness and reflection. Writer drew a diagram of a house and used interactive methods to incorporate patients in the labelling process, allowing for open response and teach back to ensure understanding. Patients were given their own sheet to label as the group shared ideas.  ?Sections and labels included:  ? ?     Foundation- Values that govern their life  ?     Walls- People and things that support them through the day to day  ?     Door- Things they hide from others   ?     Basement- Behaviors they are trying to gain control of or areas of their life they want to change  ?     1st Floor- Emotions they want to experience more often, more fully, or in a healthier way  ?     2nd Floor- List of all the things they are happy about or want to feel happy about  ?     3rd Floor/Attic- List of what a "life worth living" would look like for them  ?     Roof- People or factors that protect them  ?     Chimney- Challenging emotions and triggers they experience  ?     Smoke- Ways they "blow off steam" ?     Yard Sign- Things they are proud of and want others to see  ?     Sunshine- What brings them joy ? ?Patients were instructed to complete this with realistic answers, not filtering responses. Patients were offered debriefing on the activity and encouraged to speak on areas they like about what they listed and what they want to see change within  their diagram post discharge.  ? ? ?Affect/Mood: Appropriate ?  ?Participation Level: Engaged ?  ?Participation Quality: Independent ?  ?Behavior: Appropriate ?  ?Speech/Thought Process: Focused ?  ?Insight: Good ?  ?Judgement: Good ?  ?Modes of Intervention: DBT Techniques ?  ?Patient Response to Interventions:  Engaged ?  ?Education Outcome: ? Acknowledges education and In group clarification offered   ? ?Clinical Observations/Individualized Feedback: Pt was engaged and appropriate.  Pt was also soft spoken.  Pt responses are as follows: smoke- walking, music; chimney- anger, agitation (pt expressed this comes from "other people, myself and life"); attic- basic necessities, not being able to provide for himself; 2nd floor- still living; 1st floor- happiness, peace; door- feelings on the inside; basement- anger and sun- finish school.  Pt was open to how he felt and to some of the suggestions he received during session.  Pt was encouraged to journal to get out his emotions since he has a hard time expressing them.  Pt also he could get happiness and peace if he was able to have the basic necessities and provide for himself.  ? ? ?Plan: Continue to engage patient in RT group sessions 2-3x/week. ? ? ?Caroll Rancher, LRT,CTRS  ?04/10/2022 11:47 AM ?

## 2022-04-10 NOTE — Progress Notes (Signed)
Recreation Therapy Notes ? ?Patient admitted to unit 5.8.23. Due to admission within last year, no new recreation therapy assessment conducted at this time. Last assessment conducted on 4.19.23.   ? ?Reason for current admission per patient, drugs, overdose. ? ?Patient reports no changes in stressors from previous admission. ? ?Patient reports same goal of getting back on medications and get out of here. ? ?Patient denies SI, HI, AVH at this time.  ? ? ?Information found below from assessment conducted 4.19.23.  ? ? ?Coping Skills: Isolation, Self-Injury, TV, Sports, Music, Exercise, Substance Abuse, Talk, Prayer, Avoidance, Hot Bath/Shower ? ?Leisure Interests: Location manager, Insurance underwriter, Play instrument ? ?Patient Strengths: Survival Skills ? ?Areas of Improvement:  Communication "without showing emotions behind how I feel" ? ? ? ?Victorino Sparrow, LRT,CTRS ?Victorino Sparrow A ?04/10/2022 2:00 PM ?

## 2022-04-10 NOTE — Progress Notes (Signed)
?   04/10/22 1600  ?Psych Admission Type (Psych Patients Only)  ?Admission Status Involuntary  ?Psychosocial Assessment  ?Patient Complaints None  ?Eye Contact Avoids  ?Facial Expression Anxious  ?Affect Depressed  ?Speech Logical/coherent  ?Interaction Assertive  ?Motor Activity Fidgety  ?Appearance/Hygiene Unremarkable  ?Behavior Characteristics Appropriate to situation  ?Mood Sad  ?Thought Process  ?Coherency WDL  ?Content WDL  ?Delusions WDL  ?Perception WDL  ?Hallucination None reported or observed  ?Judgment Impaired  ?Confusion None  ?Danger to Self  ?Current suicidal ideation? Denies  ?Danger to Others  ?Danger to Others None reported or observed  ? ? ?
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
## 2022-04-10 NOTE — Progress Notes (Signed)
Adult Psychoeducational Group Note ? ?Date:  04/10/2022 ?Time:  8:15 PM ? ?Group Topic/Focus:  ?Wrap-Up Group:   The focus of this group is to help patients review their daily goal of treatment and discuss progress on daily workbooks. ? ?Participation Level:  Active ? ?Participation Quality:  Appropriate and Attentive ? ?Affect:  Appropriate ? ?Cognitive:  Appropriate ? ?Insight: Appropriate ? ?Engagement in Group:  Engaged ? ?Modes of Intervention:  Discussion ? ?Additional Comments:   ?Pt was engaged but guarded during group discussion. Pt states that he had a pretty good day and would rate his day at a 7/10. He denies everything and conveyed that one of his goals was to stay on his medication. ? ?Vevelyn Pat ?04/10/2022, 8:15 PM ?

## 2022-04-11 ENCOUNTER — Encounter (HOSPITAL_COMMUNITY): Payer: Self-pay

## 2022-04-11 LAB — BASIC METABOLIC PANEL
Anion gap: 6 (ref 5–15)
BUN: 15 mg/dL (ref 6–20)
CO2: 23 mmol/L (ref 22–32)
Calcium: 9.1 mg/dL (ref 8.9–10.3)
Chloride: 110 mmol/L (ref 98–111)
Creatinine, Ser: 1.09 mg/dL (ref 0.61–1.24)
GFR, Estimated: >60 mL/min (ref >60)
Glucose, Bld: 105 mg/dL — ABNORMAL HIGH (ref 70–99)
Potassium: 3.7 mmol/L (ref 3.5–5.1)
Sodium: 139 mmol/L (ref 135–145)

## 2022-04-11 MED ORDER — OLANZAPINE 5 MG PO TABS
5.0000 mg | ORAL_TABLET | Freq: Every day | ORAL | Status: AC
  Administered 2022-04-11: 5 mg via ORAL
  Filled 2022-04-11: qty 1

## 2022-04-11 MED ORDER — TRAZODONE HCL 100 MG PO TABS
100.0000 mg | ORAL_TABLET | Freq: Every evening | ORAL | Status: DC | PRN
  Administered 2022-04-11: 100 mg via ORAL
  Filled 2022-04-11: qty 1

## 2022-04-11 MED ORDER — OLANZAPINE 5 MG PO TABS
5.0000 mg | ORAL_TABLET | Freq: Every day | ORAL | Status: DC
Start: 2022-04-12 — End: 2022-04-11

## 2022-04-11 NOTE — Group Note (Signed)
[facility] LCSW Group Therapy Note ? ?Date/Time:  04/11/2022 / 1:00pm ? ?Type of Therapy and Topic:  Group Therapy:  Self-Care Wheel ? ?Participation Level:  Minimal  ? ?Description of Group ?This process group involved patients discussing the importance of self-care in different areas of life (professional, personal, emotional, psychological, spiritual, and physical) in order to achieve healthy life balance.  The group talked about what self-care in each of those areas would constitute and then specifically listed how they want to provide themselves with improved self-care. ? ?Therapeutic Goals ?Patient will learn how to break self-care down into various areas of life ?Patient will participate in generating ideas about healthy self-care options in each category ?Patients will be supportive of one another and receive support from others ?Patient will identify one healthy self-care activity to add to his/her life  ? ?Summary of Patient Progress:  During the discussion about self-care, he shared he does well with physical health care but struggles with social and emotional self care.  He was able to identify ways in which self-care in different areas would be helpful.  Patient's insight was improving. ? ? ?Therapeutic Modalities ?Processing ?Psychoeducation ? ? ? ?Ruthann Cancer MSW, LCSW ?Clincal Social Worker  ?[facility]  ?

## 2022-04-11 NOTE — Progress Notes (Signed)
Patient appears pleasant. Patient denies SI/HI/AVH. Patient complied with morning medication with no reported side effects. Pt reports he slept "so-so" and has a good appetite.Pt has been attending groups. Patient remains safe on Q42min checks and contracts for safety.  ? ? ? ? 04/11/22 0900  ?Psych Admission Type (Psych Patients Only)  ?Admission Status Involuntary  ?Psychosocial Assessment  ?Patient Complaints None  ?Eye Contact Avoids  ?Facial Expression Flat  ?Affect Depressed  ?Speech Logical/coherent  ?Interaction Assertive  ?Motor Activity Fidgety  ?Appearance/Hygiene Unremarkable  ?Behavior Characteristics Cooperative;Appropriate to situation  ?Mood Depressed  ?Thought Process  ?Coherency WDL  ?Content WDL  ?Delusions WDL  ?Perception WDL  ?Hallucination None reported or observed  ?Judgment Limited  ?Confusion WDL  ?Danger to Self  ?Current suicidal ideation? Denies  ?Agreement Not to Harm Self Yes  ?Description of Agreement verbal  ?Danger to Others  ?Danger to Others None reported or observed  ?Danger to Others Abnormal  ?Harmful Behavior to others No threats or harm toward other people  ?Destructive Behavior No threats or harm toward property  ? ? ?

## 2022-04-11 NOTE — Progress Notes (Addendum)
?  On assessment, pt is observed in the dayroom interacting with staff and peers.  Pt is alert and oriented.  Pt presents with no complaints.  Pt has a depressed mood and affect.  Pt reports he has spoken with family during his hospitalization.  Pt denies SI/HI/AVH, and verbally contracts for safety. ? ?Vitals checked.   Administered PRN Trazodone for sleep and Hydroxyzine for anxiety per [facility] per pt request.   ? ?Pt is safe on the unit with Q 15 minute safety checks. ? ? ? 04/11/22 2035  ?Psych Admission Type (Psych Patients Only)  ?Admission Status Involuntary  ?Psychosocial Assessment  ?Patient Complaints None  ?Eye Contact Avoids  ?Facial Expression Flat  ?Affect Depressed  ?Speech Logical/coherent  ?Interaction Assertive  ?Motor Activity Fidgety  ?Appearance/Hygiene Unremarkable  ?Behavior Characteristics Cooperative;Anxious  ?Mood Depressed  ?Thought Process  ?Coherency WDL  ?Content WDL  ?Delusions WDL  ?Perception WDL  ?Hallucination None reported or observed  ?Judgment Limited  ?Confusion WDL  ?Danger to Self  ?Current suicidal ideation? Denies  ?Agreement Not to Harm Self Yes  ?Description of Agreement Verbal  ?Danger to Others  ?Danger to Others None reported or observed  ?Danger to Others Abnormal  ?Harmful Behavior to others No threats or harm toward other people  ?Destructive Behavior No threats or harm toward property  ? ? ?

## 2022-04-11 NOTE — Progress Notes (Addendum)
[facility] MD Progress Note ? ?04/11/2022 11:44 AM ?Rickey Miller  ?MRN:  [id number] ?Reason for admission:  Patient is a 31 year old male with past psychiatric history of schizoaffective disorder bipolar type, polysubstance abuse, cocaine abuse, cannabis abuse, social anxiety disorder, GAD, nicotine dependence and alcohol use disorder who initially presented to [facility] ED on 5/3 due to worsening depression and SI with a plan. ? ?Chart review from last 24 hours-The patient's chart was reviewed and nursing notes were reviewed. Patient discussed in progression rounds with treatment team. MAR was reviewed and Pt refused Minipress, Multivitamin and Pantoprazole yesterday and required PRN medications Vistaril x 1 and Trazodone x 1. ? ?Information obtained today on the unit : Pt is seen today. Pt states his mood is "not good" and he feels depressed and angry. He did not sleep well last night and was up and down.  He states that he was having some racing thoughts last night.  Pt states his appetite is good. He talked to his brother yesterday which went well. Currently, Pt denies any active or passive suicidal ideation, homicidal ideation and, visual and auditory hallucination. He denies any cravings and withdrawal symptoms. Pt denies any headache, nausea, vomiting, dizziness, chest pain, SOB, abdominal pain, diarrhea, and constipation. Pt denies any medication side effects and has been tolerating it well.  Talked him with CSW , patient states he wants to go back to his motel and is not interested in any shelter.  He is not interested in any substance abuse treatment or rehab, states they take all his money.  Discussed that we are concerned about him going back to his motel and overdosing on drugs and hurting himself.  He states " probably".  Discussed that once we stabilize him on Haldol and then plan to give Haldol Dec.  He agrees with the plan. ? ?Principal Problem: Schizoaffective disorder, bipolar type ([facility]) ?Diagnosis: Principal  Problem: ?  Schizoaffective disorder, bipolar type ([facility]) ?Active Problems: ?  Cocaine abuse, episodic ([facility]) ?  Cannabis abuse ?  GAD (generalized anxiety disorder) ?  Nicotine dependence ?  Alcohol use disorder ?  PTSD (post-traumatic stress disorder) ?  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  Opiate abuse, episodic ([facility]) ? ?Total Time spent with patient: 30 minutes ?I personally spent 30 minutes on the unit in direct patient care. The direct patient care time included face-to-face time with the patient, reviewing the patient's chart, communicating with other professionals, and coordinating care. Greater than 50% of this time was spent in counseling or coordinating care with the patient regarding goals of hospitalization, psycho-education, and discharge planning needs.  ?Past Psychiatric History: see H&P ? ?Past Medical History: History reviewed. No pertinent past medical history.  ?Past Surgical History:  ?Procedure Laterality Date  ? TONSILLECTOMY    ? ?Family History: History reviewed. No pertinent family history. ?Family Psychiatric  History: see H&P ?Social History:  ?Social History  ? ?Substance and Sexual Activity  ?Alcohol Use Yes  ? Comment: Friday, Sat  ?   ?Social History  ? ?Substance and Sexual Activity  ?Drug Use Yes  ? Types: Marijuana  ? Comment: weekly; unable to quantify  ?  ?Social History  ? ?Socioeconomic History  ? Marital status: Unknown  ?  Spouse name: Not on file  ? Number of children: Not on file  ? Years of education: Not on file  ? Highest education level: Not on file  ?Occupational History  ? Not on file  ?Tobacco Use  ? Smoking status: Every  Day  ?  Packs/day: 1.00  ?  Years: 13.00  ?  Pack years: 13.00  ?  Types: Cigarettes  ? Smokeless tobacco: Never  ?Vaping Use  ? Vaping Use: Former  ?Substance and Sexual Activity  ? Alcohol use: Yes  ?  Comment: Friday, Sat  ? Drug use: Yes  ?  Types: Marijuana  ?  Comment: weekly; unable to quantify  ? Sexual activity: Yes  ?  Birth  control/protection: None  ?Other Topics Concern  ? Not on file  ?Social History Narrative  ? Not on file  ? ?Social Determinants of Health  ? ?Financial Resource Strain: Not on file  ?Food Insecurity: Not on file  ?Transportation Needs: Not on file  ?Physical Activity: Not on file  ?Stress: Not on file  ?Social Connections: Not on file  ? ?Additional Social History:  ?  ?  ?  ?  ?  ?  ?  ?  ?  ?  ?  ? ?Sleep: Fair ? ?Appetite:  Good ? ?Current Medications: ?Current Facility-Administered Medications  ?Medication Dose Route Frequency Provider Last Rate Last Admin  ? acetaminophen (TYLENOL) tablet 650 mg  650 mg Oral Q6H PRN Estella Husk, MD      ? alum & mag hydroxide-simeth (MAALOX/MYLANTA) 200-200-20 MG/5ML suspension 30 mL  30 mL Oral Q4H PRN Estella Husk, MD      ? benztropine (COGENTIN) tablet 1 mg  1 mg Oral BID PRN Comer Locket, MD      ? dicyclomine (BENTYL) tablet 20 mg  20 mg Oral Q6H PRN Mason Jim, Andilynn Delavega E, MD      ? haloperidol (HALDOL) tablet 5 mg  5 mg Oral BID Karsten Ro, MD   5 mg at 04/11/22 1751  ? hydrOXYzine (ATARAX) tablet 25 mg  25 mg Oral Q6H PRN Comer Locket, MD   25 mg at 04/10/22 2109  ? loperamide (IMODIUM) capsule 2-4 mg  2-4 mg Oral PRN Comer Locket, MD      ? LORazepam (ATIVAN) tablet 1 mg  1 mg Oral Q6H PRN Comer Locket, MD      ? LORazepam (ATIVAN) tablet 1 mg  1 mg Oral Q6H PRN Mason Jim, Oneal Biglow E, MD      ? magnesium hydroxide (MILK OF MAGNESIA) suspension 30 mL  30 mL Oral Daily PRN Estella Husk, MD      ? methocarbamol (ROBAXIN) tablet 500 mg  500 mg Oral Q8H PRN Comer Locket, MD      ? multivitamin with minerals tablet 1 tablet  1 tablet Oral Daily Bartholomew Crews E, MD   1 tablet at 04/11/22 0258  ? naproxen (NAPROSYN) tablet 500 mg  500 mg Oral BID PRN Comer Locket, MD      ? nicotine polacrilex (NICORETTE) gum 2 mg  2 mg Oral PRN Estella Husk, MD      ? OLANZapine (ZYPREXA) tablet 5 mg  5 mg Oral Q12H Karsten Ro, MD   5 mg  at 04/11/22 0753  ? OLANZapine zydis (ZYPREXA) disintegrating tablet 10 mg  10 mg Oral Q8H PRN Phineas Inches, MD   10 mg at 04/09/22 1740  ? And  ? ziprasidone (GEODON) injection 20 mg  20 mg Intramuscular Q8H PRN Massengill, Nathan, MD      ? ondansetron (ZOFRAN-ODT) disintegrating tablet 4 mg  4 mg Oral Q6H PRN Comer Locket, MD      ? pantoprazole (PROTONIX) EC tablet  40 mg  40 mg Oral Daily Comer LocketSingleton, Charan Prieto E, MD   40 mg at 04/11/22 [id number]  ? prazosin (MINIPRESS) capsule 1 mg  1 mg Oral QHS Estella HuskLaubach, Katherine S, MD   1 mg at 04/09/22 2155  ? thiamine tablet 100 mg  100 mg Oral Daily Estella HuskLaubach, Katherine S, MD   100 mg at 04/11/22 0753  ? traZODone (DESYREL) tablet 50 mg  50 mg Oral QHS PRN Estella HuskLaubach, Katherine S, MD   50 mg at 04/10/22 2109  ? traZODone (DESYREL) tablet 50 mg  50 mg Oral Once Jackelyn PolingBerry, Jason A, NP      ? ? ?Lab Results:  ?Results for orders placed or performed during the hospital encounter of 04/09/22 (from the past 48 hour(s))  ?Rapid urine drug screen (hospital performed)     Status: Abnormal  ? Collection Time: 04/09/22  6:03 PM  ?Result Value Ref Range  ? Opiates NONE DETECTED NONE DETECTED  ? Cocaine NONE DETECTED NONE DETECTED  ? Benzodiazepines NONE DETECTED NONE DETECTED  ? Amphetamines NONE DETECTED NONE DETECTED  ? Tetrahydrocannabinol POSITIVE (A) NONE DETECTED  ? Barbiturates NONE DETECTED NONE DETECTED  ?  Comment: (NOTE) ?DRUG SCREEN FOR MEDICAL PURPOSES ?ONLY.  IF CONFIRMATION IS NEEDED ?FOR ANY PURPOSE, NOTIFY LAB ?WITHIN 5 DAYS. ? ?LOWEST DETECTABLE LIMITS ?FOR URINE DRUG SCREEN ?Drug Class                     Cutoff (ng/mL) ?Amphetamine and metabolites    1000 ?Barbiturate and metabolites    200 ?Benzodiazepine                 200 ?Tricyclics and metabolites     300 ?Opiates and metabolites        300 ?Cocaine and metabolites        300 ?THC                            50 ?Performed at [facility], [address] ?[place], [place] [id number] ?  ?Basic metabolic  panel     Status: Abnormal  ? Collection Time: 04/11/22  6:44 AM  ?Result Value Ref Range  ? Sodium 139 135 - 145 mmol/L  ? Potassium 3.7 3.5 - 5.1 mmol/L  ? Chloride 110 98 - 111 mmol/L  ? CO2 23 22 - 32 mmol/L  ?

## 2022-04-11 NOTE — Plan of Care (Signed)
?  Problem: Education: ?Goal: Knowledge of  General Education information/materials will improve ?Outcome: Progressing ?Goal: Emotional status will improve ?Outcome: Progressing ?Goal: Mental status will improve ?Outcome: Progressing ?  ?Problem: Health Behavior/Discharge Planning: ?Goal: Compliance with treatment plan for underlying cause of condition will improve ?Outcome: Progressing ?  ?Problem: Physical Regulation: ?Goal: Ability to maintain clinical measurements within normal limits will improve ?Outcome: Progressing ?  ?Problem: Safety: ?Goal: Periods of time without injury will increase ?Outcome: Progressing ?  ?Problem: Safety: ?Goal: Periods of time without injury will increase ?Outcome: Progressing ?  ?Problem: Activity: ?Goal: Sleeping patterns will improve ?Outcome: Not Progressing ?  ?

## 2022-04-11 NOTE — BH IP Treatment Plan (Signed)
Interdisciplinary Treatment and Diagnostic Plan Update ? ?04/11/2022 ?Time of Session: 10:05am ?Rickey Miller ?MRN: [id number] ? ?Principal Diagnosis: Schizoaffective disorder, bipolar type ([place]) ? ?Secondary Diagnoses: Principal Problem: ?  Schizoaffective disorder, bipolar type ([place]) ?Active Problems: ?  Cocaine abuse, episodic ([place]) ?  Cannabis abuse ?  GAD (generalized anxiety disorder) ?  Nicotine dependence ?  Alcohol use disorder ?  PTSD (post-traumatic stress disorder) ?  Amphetamine abuse, episodic ([place]) ?  Opiate abuse, episodic ([place]) ? ? ?Current Medications:  ?Current Facility-Administered Medications  ?Medication Dose Route Frequency Provider Last Rate Last Admin  ? acetaminophen (TYLENOL) tablet 650 mg  650 mg Oral Q6H PRN Ival Bible, MD      ? alum & mag hydroxide-simeth (MAALOX/MYLANTA) 200-200-20 MG/5ML suspension 30 mL  30 mL Oral Q4H PRN Ival Bible, MD      ? benztropine (COGENTIN) tablet 1 mg  1 mg Oral BID PRN Harlow Asa, MD      ? dicyclomine (BENTYL) tablet 20 mg  20 mg Oral Q6H PRN Nelda Marseille, Amy E, MD      ? haloperidol (HALDOL) tablet 5 mg  5 mg Oral BID Armando Reichert, MD   5 mg at 04/11/22 1021  ? hydrOXYzine (ATARAX) tablet 25 mg  25 mg Oral Q6H PRN Harlow Asa, MD   25 mg at 04/10/22 2109  ? loperamide (IMODIUM) capsule 2-4 mg  2-4 mg Oral PRN Harlow Asa, MD      ? LORazepam (ATIVAN) tablet 1 mg  1 mg Oral Q6H PRN Harlow Asa, MD      ? LORazepam (ATIVAN) tablet 1 mg  1 mg Oral Q6H PRN Nelda Marseille, Amy E, MD      ? magnesium hydroxide (MILK OF MAGNESIA) suspension 30 mL  30 mL Oral Daily PRN Ival Bible, MD      ? methocarbamol (ROBAXIN) tablet 500 mg  500 mg Oral Q8H PRN Harlow Asa, MD      ? multivitamin with minerals tablet 1 tablet  1 tablet Oral Daily Viann Fish E, MD   1 tablet at 04/11/22 1173  ? naproxen (NAPROSYN) tablet 500 mg  500 mg Oral BID PRN Harlow Asa, MD      ? nicotine polacrilex (NICORETTE) gum 2 mg  2  mg Oral PRN Ival Bible, MD      ? OLANZapine (ZYPREXA) tablet 5 mg  5 mg Oral Q12H Armando Reichert, MD   5 mg at 04/11/22 0753  ? OLANZapine zydis (ZYPREXA) disintegrating tablet 10 mg  10 mg Oral Q8H PRN Janine Limbo, MD   10 mg at 04/09/22 1740  ? And  ? ziprasidone (GEODON) injection 20 mg  20 mg Intramuscular Q8H PRN Massengill, Nathan, MD      ? ondansetron (ZOFRAN-ODT) disintegrating tablet 4 mg  4 mg Oral Q6H PRN Nelda Marseille, Amy E, MD      ? pantoprazole (PROTONIX) EC tablet 40 mg  40 mg Oral Daily Harlow Asa, MD   40 mg at 04/11/22 5670  ? prazosin (MINIPRESS) capsule 1 mg  1 mg Oral QHS Ival Bible, MD   1 mg at 04/09/22 2155  ? thiamine tablet 100 mg  100 mg Oral Daily Ival Bible, MD   100 mg at 04/11/22 0753  ? traZODone (DESYREL) tablet 50 mg  50 mg Oral QHS PRN Ival Bible, MD   50 mg at 04/10/22 2109  ? traZODone (DESYREL) tablet 50 mg  50 mg Oral Once Rozetta Nunnery, NP      ? ?PTA Medications: ?Medications Prior to Admission  ?Medication Sig Dispense Refill Last Dose  ? guanFACINE (INTUNIV) 1 MG TB24 ER tablet Take 1 tablet (1 mg total) by mouth daily. (Patient not taking: Reported on 04/04/2022) 30 tablet 0   ? hydrOXYzine (ATARAX) 25 MG tablet Take 1 tablet (25 mg total) by mouth 3 (three) times daily as needed for anxiety. 30 tablet 0   ? nicotine polacrilex (NICORETTE) 2 MG gum Take 1 each (2 mg total) by mouth as needed for smoking cessation. (Patient not taking: Reported on 04/04/2022) 100 tablet 0   ? OLANZapine (ZYPREXA) 10 MG tablet Take 1 tablet (10 mg total) by mouth 2 (two) times daily.     ? prazosin (MINIPRESS) 1 MG capsule Take 1 capsule (1 mg total) by mouth at bedtime. 30 capsule 0   ? thiamine 100 MG tablet Take 1 tablet (100 mg total) by mouth daily. (Patient not taking: Reported on 04/04/2022) 30 tablet 0   ? traZODone (DESYREL) 50 MG tablet Take 1 tablet (50 mg total) by mouth at bedtime as needed for sleep. (Patient not taking: Reported  on 04/04/2022) 30 tablet 0   ? ? ?Patient Stressors: Medication change or noncompliance   ?Substance abuse   ?Other: housing   ? ?Patient Strengths: Ability for insight  ?Communication skills  ?Physical Health  ? ?Treatment Modalities: Medication Management, Group therapy, Case management,  ?1 to 1 session with clinician, Psychoeducation, Recreational therapy. ? ? ?Physician Treatment Plan for Primary Diagnosis: Schizoaffective disorder, bipolar type ([place]) ?Long Term Goal(s):    ? ?Short Term Goals:   ? ?Medication Management: Evaluate patient's response, side effects, and tolerance of medication regimen. ? ?Therapeutic Interventions: 1 to 1 sessions, Unit Group sessions and Medication administration. ? ?Evaluation of Outcomes: Not Met ? ?Physician Treatment Plan for Secondary Diagnosis: Principal Problem: ?  Schizoaffective disorder, bipolar type ([place]) ?Active Problems: ?  Cocaine abuse, episodic ([place]) ?  Cannabis abuse ?  GAD (generalized anxiety disorder) ?  Nicotine dependence ?  Alcohol use disorder ?  PTSD (post-traumatic stress disorder) ?  Amphetamine abuse, episodic ([place]) ?  Opiate abuse, episodic ([place]) ? ?Long Term Goal(s):    ? ?Short Term Goals:      ? ?Medication Management: Evaluate patient's response, side effects, and tolerance of medication regimen. ? ?Therapeutic Interventions: 1 to 1 sessions, Unit Group sessions and Medication administration. ? ?Evaluation of Outcomes: Not Met ? ? ?RN Treatment Plan for Primary Diagnosis: Schizoaffective disorder, bipolar type ([place]) ?Long Term Goal(s): Knowledge of disease and therapeutic regimen to maintain health will improve ? ?Short Term Goals: Ability to remain free from injury will improve, Ability to verbalize frustration and anger appropriately will improve, Ability to demonstrate self-control, Ability to disclose and discuss suicidal ideas, Ability to identify and develop effective coping behaviors will improve, and Compliance with prescribed medications  will improve ? ?Medication Management: RN will administer medications as ordered by provider, will assess and evaluate patient's response and provide education to patient for prescribed medication. RN will report any adverse and/or side effects to prescribing provider. ? ?Therapeutic Interventions: 1 on 1 counseling sessions, Psychoeducation, Medication administration, Evaluate responses to treatment, Monitor vital signs and CBGs as ordered, Perform/monitor CIWA, COWS, AIMS and Fall Risk screenings as ordered, Perform wound care treatments as ordered. ? ?Evaluation of Outcomes: Not Met ? ? ?LCSW Treatment Plan for Primary Diagnosis: Schizoaffective disorder, bipolar type ([place]) ?  Long Term Goal(s): Safe transition to appropriate next level of care at discharge, Engage patient in therapeutic group addressing interpersonal concerns. ? ?Short Term Goals: Engage patient in aftercare planning with referrals and resources, Increase social support, Increase ability to appropriately verbalize feelings, Increase emotional regulation, Identify triggers associated with mental health/substance abuse issues, and Increase skills for wellness and recovery ? ?Therapeutic Interventions: Assess for all discharge needs, 1 to 1 time with Education officer, museum, Explore available resources and support systems, Assess for adequacy in community support network, Educate family and significant other(s) on suicide prevention, Complete Psychosocial Assessment, Interpersonal group therapy. ? ?Evaluation of Outcomes: Not Met ? ? ?Progress in Treatment: ?Attending groups: Yes. ?Participating in groups: Yes. ?Taking medication as prescribed: Yes. ?Toleration medication: Yes. ?Family/Significant other contact made: No, will contact:  if consent is provided ?Patient understands diagnosis: No. ?Discussing patient identified problems/goals with staff: Yes. ?Medical problems stabilized or resolved: Yes. ?Denies suicidal/homicidal ideation: Yes. ?Issues/concerns  per patient self-inventory: No. ? ? ?New problem(s) identified: No, Describe:  none ? ?New Short Term/Long Term Goal(s): detox, medication management for mood stabilization; elimination of SI thoughts;

## 2022-04-11 NOTE — Progress Notes (Signed)
Patient is flat and guarded isolative to his room and minimal on interaction. Denies SI/HI/A/VH and verbally contracted for safety. Complaining of sleep disturbances after taking prn trazodone 50 mg and Provider notified. Patient went back to his room before taking oto of trazodone. Support and encouragement provided. Q 15 minutes safety checks ongoing Patient remains safe.  ?

## 2022-04-11 NOTE — Group Note (Signed)
Recreation Therapy Group Note ? ? ?Group Topic:Other  ?Group Date: 04/11/2022 ?Start Time: 1000 ?End Time: 1050 ?Facilitators: Caroll Rancher, LRT,CTRS ?Location: [address] Day[address] ? ? ?Goal Area(s) Addresses:  ?Patient will successfully identify lessons they have learned thus far in life. ?Patient will successfully identify things that make them unique. ?  ?Group Description: LRT facilitated a game of dialogue that allowed patients to share things about themselves with the rest of the group. Patients would individually go head to head in a game of [place].  The game would be played in its traditional form but the LRT would ask each person a question when it was their turn.  As in the regular game of [place] Plover, the person who knocks the tower over, loses the game.  Patients would be asked questions from topics about leisure to personal development. ? ? ?Affect/Mood: Appropriate ?  ?Participation Level: Engaged ?  ?Participation Quality: Independent ?  ?Behavior: Appropriate and Interactive  ?  ?Speech/Thought Process: Focused ?  ?Insight: Good ?  ?Judgement: Good ?  ?Modes of Intervention: Competitive Play ?  ?Patient Response to Interventions:  Engaged ?  ?Education Outcome: ? Acknowledges education and In group clarification offered   ? ?Clinical Observations/Individualized Feedback: Pt was bright and seemed to be enjoying the activity.  Pt was competitive with peer in not being the person to knock over the tower.  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  ? ? ?Plan: Continue to engage patient in RT group sessions 2-3x/week. ? ? ?Caroll Rancher, LRT,CTRS  ?04/11/2022 1:36 PM ?
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
## 2022-04-11 NOTE — Plan of Care (Signed)
  Problem: Activity: Goal: Interest or engagement in activities will improve Outcome: Progressing   Problem: Education: Goal: Emotional status will improve Outcome: Not Progressing Goal: Mental status will improve Outcome: Not Progressing   

## 2022-04-11 NOTE — BHH Group Notes (Signed)
Adult Psychoeducational Group Note ? ?Date:  04/11/2022 ?Time:  9:26 PM ? ?Group Topic/Focus:  ?Building Self Esteem:   The Focus of this group is helping patients become aware of the effects of self-esteem on their lives, the things they and others do that enhance or undermine their self-esteem, seeing the relationship between their level of self-esteem and the choices they make and learning ways to enhance self-esteem. ? ?Participation Level:  Active ? ?Participation Quality:  Appropriate and Attentive ? ?Affect:  Appropriate ? ?Cognitive:  Alert ? ?Insight: Good ? ?Engagement in Group:  Engaged ? ?Modes of Intervention:  Discussion ? ?Additional Comments:   ? ?Jill Poling Daana Petrasek ?04/11/2022, 9:26 PM ?

## 2022-04-12 MED ORDER — HALOPERIDOL DECANOATE 100 MG/ML IM SOLN
100.0000 mg | Freq: Once | INTRAMUSCULAR | Status: AC
Start: 2022-04-12 — End: 2022-04-12
  Administered 2022-04-12: 100 mg via INTRAMUSCULAR
  Filled 2022-04-12: qty 1

## 2022-04-12 MED ORDER — TRAZODONE HCL 100 MG PO TABS
100.0000 mg | ORAL_TABLET | Freq: Every day | ORAL | Status: DC
  Administered 2022-04-12: 100 mg via ORAL
  Filled 2022-04-12: qty 7
  Filled 2022-04-12 (×2): qty 1

## 2022-04-12 NOTE — Group Note (Signed)
Occupational Therapy Group Note ? ?Group Topic:Other  ?Group Date: 04/12/2022 ?Start Time: 1400 ?End Time: 1600 ?Facilitators: Ted Mcalpine, OT  ? ? ?Today's OT group session focuses on the importance of setting and maintaining healthy boundaries. OT explored the definition of boundaries and provides examples of healthy boundaries, including knowing one's limits, saying no when necessary, and communicating needs clearly. OT emphasized the benefits of healthy boundary-setting, including improved self-esteem, increased control, and enhanced overall functioning. Common difficulties in setting and adhering to healthy boundaries are also discussed, such as fear of rejection, guilt, and conflict. The group also explores the impact of boundaries on one's ADLs including time management, sleep hygiene, self-care activities, and social participation.  ? ? ? ? ?Participation Level: Active and Engaged ?  ?Participation Quality: Independent ?  ?Behavior: Appropriate, Calm, and Cooperative ?  ?Speech/Thought Process: Coherent, Focused, and Organized ?  ?Affect/Mood: Appropriate ?  ?Insight: Good ?  ?Judgement: Good ?  ?Individualization: Pt was active and engaged in their participation of group discussion/activity. New boundary setting skills were identified  ?Modes of Intervention: Discussion and Education  ?Patient Response to Interventions:  Attentive, Engaged, and Receptive ?  ?Plan: Continue to engage patient in OT groups 2 - 3x/week. ? ?04/12/2022  ?Ted Mcalpine, OT ?Kerrin Champagne, OT ? ? ? ? ?

## 2022-04-12 NOTE — Progress Notes (Signed)
[facility] MD Progress Note ? ?04/12/2022 12:06 PM ?Rickey Miller  ?MRN:  [id number] ?Reason for admission:  Patient is a 31 year old male with past psychiatric history of schizoaffective disorder bipolar type, polysubstance abuse, cocaine abuse, cannabis abuse, social anxiety disorder, GAD, nicotine dependence and alcohol use disorder who initially presented to [facility] ED on 5/3 due to worsening depression and SI with a plan. ? ?Chart review from last 24 hours-The patient's chart was reviewed and nursing notes were reviewed. Patient discussed in progression rounds with treatment team. MAR was reviewed and Pt refused Protonix yesterday and required PRN medications Vistaril x 1 for anxiety yesterday.  Patient slept 8.25 hours last night. ? ?Information obtained today on the unit : Pt is seen today.  Patient is guarded.  He he minimizes his depression symptoms.  Does not share much information.  pt states his mood is "fine" and he appears depressed and irritable. He did not sleep well last night and kept waking up.  Pt states his appetite is stable. He talked to his brother but he does not want to go to [place] and wants to stay in [place]. Currently, Pt denies any active or passive suicidal ideation, homicidal ideation and, visual and auditory hallucination. He denies any cravings and withdrawal symptoms. Pt denies any headache, nausea, vomiting, dizziness, chest pain, SOB, abdominal pain, diarrhea, and constipation. Pt denies any medication side effects and has been tolerating it well.  When asked if he is interested in alcohol/drug rehab programs. Patient gets irritable states he has told multiple times that he does not want to go to any rehab.  He is requesting LAI so that he can leave. Discussed that we will order Haldol decanoate today but will have to monitor him for few days for side effects. He verbalizes understanding.  Discussed that we are also concerned that he is going to overdose on drugs.  He states "I won't  ".  He denies any other concerns. ? ?Principal Problem: Schizoaffective disorder (HCC) ?Diagnosis: Principal Problem: ?  Schizoaffective disorder (HCC) ?Active Problems: ?  Cocaine abuse, episodic (HCC) ?  Cannabis abuse ?  GAD (generalized anxiety disorder) ?  Nicotine dependence ?  Alcohol use disorder ?  PTSD (post-traumatic stress disorder) ?  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  Opiate abuse, episodic (HCC) ? ?Total Time spent with patient: 30 minutes ?I personally spent 30 minutes on the unit in direct patient care. The direct patient care time included face-to-face time with the patient, reviewing the patient's chart, communicating with other professionals, and coordinating care. Greater than 50% of this time was spent in counseling or coordinating care with the patient regarding goals of hospitalization, psycho-education, and discharge planning needs.  ?Past Psychiatric History: see H&P ? ?Past Medical History: History reviewed. No pertinent past medical history.  ?Past Surgical History:  ?Procedure Laterality Date  ? TONSILLECTOMY    ? ?Family History: History reviewed. No pertinent family history. ?Family Psychiatric  History: see H&P ?Social History:  ?Social History  ? ?Substance and Sexual Activity  ?Alcohol Use Yes  ? Comment: Friday, Sat  ?   ?Social History  ? ?Substance and Sexual Activity  ?Drug Use Yes  ? Types: Marijuana  ? Comment: weekly; unable to quantify  ?  ?Social History  ? ?Socioeconomic History  ? Marital status: Unknown  ?  Spouse name: Not on file  ? Number of children: Not on file  ? Years of education: Not on file  ? Highest education level: Not on file  ?  Occupational History  ? Not on file  ?Tobacco Use  ? Smoking status: Every Day  ?  Packs/day: 1.00  ?  Years: 13.00  ?  Pack years: 13.00  ?  Types: Cigarettes  ? Smokeless tobacco: Never  ?Vaping Use  ? Vaping Use: Former  ?Substance and Sexual Activity  ? Alcohol use: Yes  ?  Comment: Friday, Sat  ? Drug use: Yes  ?  Types:  Marijuana  ?  Comment: weekly; unable to quantify  ? Sexual activity: Yes  ?  Birth control/protection: None  ?Other Topics Concern  ? Not on file  ?Social History Narrative  ? Not on file  ? ?Social Determinants of Health  ? ?Financial Resource Strain: Not on file  ?Food Insecurity: Not on file  ?Transportation Needs: Not on file  ?Physical Activity: Not on file  ?Stress: Not on file  ?Social Connections: Not on file  ? ?Additional Social History:  ?  ?  ?  ?  ?  ?  ?  ?  ?  ?  ?  ? ?Sleep: Fair ? ?Appetite:  Good ? ?Current Medications: ?Current Facility-Administered Medications  ?Medication Dose Route Frequency Provider Last Rate Last Admin  ? acetaminophen (TYLENOL) tablet 650 mg  650 mg Oral Q6H PRN Estella Husk, MD      ? alum & mag hydroxide-simeth (MAALOX/MYLANTA) 200-200-20 MG/5ML suspension 30 mL  30 mL Oral Q4H PRN Estella Husk, MD      ? benztropine (COGENTIN) tablet 1 mg  1 mg Oral BID PRN Comer Locket, MD      ? dicyclomine (BENTYL) tablet 20 mg  20 mg Oral Q6H PRN Mason Jim, Amy E, MD      ? haloperidol (HALDOL) tablet 5 mg  5 mg Oral BID Karsten Ro, MD   5 mg at 04/12/22 0802  ? hydrOXYzine (ATARAX) tablet 25 mg  25 mg Oral Q6H PRN Comer Locket, MD   25 mg at 04/11/22 2037  ? loperamide (IMODIUM) capsule 2-4 mg  2-4 mg Oral PRN Comer Locket, MD      ? LORazepam (ATIVAN) tablet 1 mg  1 mg Oral Q6H PRN Comer Locket, MD      ? LORazepam (ATIVAN) tablet 1 mg  1 mg Oral Q6H PRN Mason Jim, Amy E, MD      ? magnesium hydroxide (MILK OF MAGNESIA) suspension 30 mL  30 mL Oral Daily PRN Estella Husk, MD      ? methocarbamol (ROBAXIN) tablet 500 mg  500 mg Oral Q8H PRN Comer Locket, MD      ? multivitamin with minerals tablet 1 tablet  1 tablet Oral Daily Bartholomew Crews E, MD   1 tablet at 04/12/22 8250  ? naproxen (NAPROSYN) tablet 500 mg  500 mg Oral BID PRN Comer Locket, MD      ? nicotine polacrilex (NICORETTE) gum 2 mg  2 mg Oral PRN Estella Husk,  MD      ? OLANZapine zydis (ZYPREXA) disintegrating tablet 10 mg  10 mg Oral Q8H PRN Phineas Inches, MD   10 mg at 04/09/22 1740  ? And  ? ziprasidone (GEODON) injection 20 mg  20 mg Intramuscular Q8H PRN Massengill, Nathan, MD      ? ondansetron (ZOFRAN-ODT) disintegrating tablet 4 mg  4 mg Oral Q6H PRN Mason Jim, Amy E, MD      ? pantoprazole (PROTONIX) EC tablet 40 mg  40 mg Oral Daily  Comer LocketSingleton, Amy E, MD   40 mg at 04/11/22 16100752  ? thiamine tablet 100 mg  100 mg Oral Daily Estella HuskLaubach, Katherine S, MD   100 mg at 04/12/22 0802  ? traZODone (DESYREL) tablet 100 mg  100 mg Oral QHS Karsten Rooda, Sunita Demond, MD      ? traZODone (DESYREL) tablet 50 mg  50 mg Oral Once Jackelyn PolingBerry, Jason A, NP      ? ? ?Lab Results:  ?Results for orders placed or performed during the hospital encounter of 04/09/22 (from the past 48 hour(s))  ?Basic metabolic panel     Status: Abnormal  ? Collection Time: 04/11/22  6:44 AM  ?Result Value Ref Range  ? Sodium 139 135 - 145 mmol/L  ? Potassium 3.7 3.5 - 5.1 mmol/L  ? Chloride 110 98 - 111 mmol/L  ? CO2 23 22 - 32 mmol/L  ? Glucose, Bld 105 (H) 70 - 99 mg/dL  ?  Comment: Glucose reference range applies only to samples taken after fasting for at least 8 hours.  ? BUN 15 6 - 20 mg/dL  ? Creatinine, Ser 1.09 0.61 - 1.24 mg/dL  ? Calcium 9.1 8.9 - 10.3 mg/dL  ? GFR, Estimated >60 >60 mL/min  ?  Comment: (NOTE) ?Calculated using the CKD-EPI Creatinine Equation (2021) ?  ? Anion gap 6 5 - 15  ?  Comment: Performed at [facility], [address] [place], [place] [id number]  ? ? ?Blood Alcohol level:  ?Lab Results  ?Component Value Date  ? ETH <10 04/04/2022  ? ETH <10 03/17/2022  ? ? ?Metabolic Disorder Labs: ?Lab Results  ?Component Value Date  ? HGBA1C 4.8 03/21/2022  ? MPG 91.06 03/21/2022  ? ?No results found for: PROLACTIN ?Lab Results  ?Component Value Date  ? CHOL 184 03/21/2022  ? TRIG 66 03/21/2022  ? HDL 55 03/21/2022  ? CHOLHDL 3.3 03/21/2022  ? VLDL 13 03/21/2022  ? LDLCALC 116  (H) 03/21/2022  ? ? ?Physical Findings: ?AIMS: Facial and Oral Movements ?Muscles of Facial Expression: None, normal ?Lips and Perioral Area: None, normal ?Jaw: None, normal ?Tongue: None, normal,Extremity

## 2022-04-12 NOTE — Group Note (Signed)
Recreation Therapy Group Note ? ? ?Group Topic:Problem Solving  ?Group Date: 04/12/2022 ?Start Time: 1005 ?End Time: 1045 ?Facilitators: Caroll Rancher, LRT,CTRS ?Location: [address] Dayroom ? ? ?Goal Area(s) Addresses:  ?Patient will effectively work with peer towards shared goal.  ?Patient will identify skills used to make activity successful.  ?Patient will share challenges and verbalize solution-driven approaches used. ?Patient will identify how skills used during activity can be used to reach post d/c goals.  ?  ?Group Description: [employer]. Patients were provided the following materials: 5 drinking straws, 5 rubber bands, 5 paper clips, 2 index cards and 2 drinking cups. Using the provided materials patients were asked to build a launching mechanism to launch a ping pong ball across the room, approximately 10 feet. Patients were divided into teams of 3-5. Instructions required all materials be incorporated into the device, functionality of items left to the peer group's discretion. ? ? ?Affect/Mood: N/A ?  ?Participation Level: Did not attend ?  ? ?Clinical Observations/Individualized Feedback:  ?  ? ?Plan: Continue to engage patient in RT group sessions 2-3x/week. ? ? ?Caroll Rancher, LRT,CTRS ?04/12/2022 11:49 AM ?

## 2022-04-13 ENCOUNTER — Ambulatory Visit: Payer: Self-pay

## 2022-04-13 MED ORDER — ADULT MULTIVITAMIN W/MINERALS CH: 1.0000 | ORAL_TABLET | Freq: Every day | ORAL | 0 refills | Status: AC

## 2022-04-13 MED ORDER — HALOPERIDOL 5 MG PO TABS: 5.0000 mg | ORAL_TABLET | Freq: Two times a day (BID) | ORAL | 0 refills | Status: AC

## 2022-04-13 MED ORDER — THIAMINE HCL 100 MG PO TABS: 100.0000 mg | ORAL_TABLET | Freq: Every day | ORAL | 0 refills | Status: AC

## 2022-04-13 MED ORDER — NICOTINE POLACRILEX 2 MG MT GUM: 2.0000 mg | CHEWING_GUM | OROMUCOSAL | 0 refills | Status: AC | PRN

## 2022-04-13 MED ORDER — BENZTROPINE MESYLATE 1 MG PO TABS: 1.0000 mg | ORAL_TABLET | Freq: Two times a day (BID) | ORAL | 0 refills | Status: AC

## 2022-04-13 MED ORDER — TRAZODONE HCL 100 MG PO TABS: 100.0000 mg | ORAL_TABLET | Freq: Every day | ORAL | 0 refills | Status: AC

## 2022-04-13 NOTE — [facility] Suicide Risk Assessment (Signed)
Suicide Risk Assessment ? ?Discharge Assessment    ?[facility] Discharge Suicide Risk Assessment ? ? ?Principal Problem: Schizoaffective disorder (HCC) ?Discharge Diagnoses: Principal Problem: ?  Schizoaffective disorder (HCC) ?Active Problems: ?  Cocaine abuse, episodic (HCC) ?  Cannabis abuse ?  GAD (generalized anxiety disorder) ?  Nicotine dependence ?  Alcohol use disorder ?  PTSD (post-traumatic stress disorder) ?  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  Opiate abuse, episodic (HCC) ? ? ?Total Time spent with patient: 30 minutes ?HOSPITAL COURSE: ? ?During the patient's hospitalization, patient had extensive initial psychiatric evaluation, and follow-up psychiatric evaluations every day. ? ?Psychiatric diagnoses provided upon initial assessment:  ?Schizoaffective disorder (HCC) ?  Polysubstance abuse (HCC) ?  Cannabis abuse ?  GAD (generalized anxiety disorder) ?  Nicotine dependence ?  Alcohol use disorder ?  PTSD (post-traumatic stress disorder) ?Patient's psychiatric medications were adjusted on admission:  ?-Decreased Zyprexa  5 mg BID and start tapering off med. ?-Started Haldol 5 mg twice daily for psychosis. (R/b/se/a discussed and patient agrees with medication trial).  Plan for LAI.  ?-Continued Minipress 1 mg QHS for PTSD ?-Discontinued Intuniv. ?-Continued agitation protocol with Zyprexa /Geodon ?-Continued Cogentin 1 mg twice daily as needed for EPS prevention ?- Started COWS and CIWA monitoring with as needed Ativan ?-Continued thiamine 100 mg daily. ?-Continued multivitamin with minerals daily. ?-Continued Zofran 4 mg every 6 hours as needed for nausea or vomiting. ?-Continued Imodium 2 to 4 mg as needed for diarrhea or loose stools for 72 hours.  ? -Continue Nicotine Gum as needed ?  ?During the hospitalization, other adjustments were made to the patient's psychiatric medication regimen:  ?-Zyprexa tapered and stopped ?-Continued Haldol 5 mg twice daily for psychosis. (R/b/se/a discussed and patient  agrees with medication trial).  ?-Pt received Haldol decanoate LAI 100 mg on 5/12 and Q30days.Next Haldol decanoate due on 05/13/22. ? ?-Prazosin discontinued per patient's request. ?-Changed trazodone to 100 mg qhs scheduled for insomnia ?-Intuniv discontinued. ?-Started Cogentin 1 mg twice daily as needed for EPS prevention. ? ?Patient's care was discussed during the interdisciplinary team meeting every day during the hospitalization. ? ?-The risks of potentially developing TD/EPS, weight gain, blood dyscrasias, EKG changes, DM, and high cholesterol while taking an antipsychotic were discussed in detail with the patient. The patient verbalized understanding of discussed risks and agreed to medication trial. The patient was made aware of the need for ongoing lab, AIMS, EKG, and weight monitoring with use of an antipsychotic.   ?The patient denies having side effects to prescribed psychiatric medication. ? ?Gradually, patient started adjusting to milieu. The patient was evaluated each day by a clinical provider to ascertain response to treatment. Improvement was noted by the patient's report of decreasing symptoms, improved sleep and appetite, affect, medication tolerance, behavior, and participation in unit programming.  Patient was asked each day to complete a self inventory noting mood, mental status, pain, new symptoms, anxiety and concerns.   ? ?Symptoms were reported as significantly decreased or resolved completely by discharge.  ? ?On day of discharge, the patient reports that their mood is stable. The patient denied having suicidal thoughts for more than 48 hours prior to discharge.  Patient denies having homicidal thoughts.  Patient denies having auditory hallucinations.  Patient denies any visual hallucinations or other symptoms of psychosis. The patient was motivated to continue taking medication with a goal of continued improvement in mental health.  ? ?The patient reports their target psychiatric  symptoms of Schizoaffective disorder responded well to  the psychiatric medications, and the patient reports overall benefit other psychiatric hospitalization. Supportive psychotherapy was provided to the patient. The patient also participated in regular group therapy while hospitalized. Coping skills, problem solving as well as relaxation therapies were also part of the unit programming. ? ?Labs were reviewed with the patient, and abnormal results were discussed with the patient. ? ?The patient is able to verbalize their individual safety plan to this provider. ? ?# It is recommended to the patient to continue psychiatric medications as prescribed, after discharge from the hospital.   ? ?# It is recommended to the patient to follow up with your outpatient psychiatric provider and PCP. ? ?# It was discussed with the patient, the impact of alcohol, drugs, tobacco have been there overall psychiatric and medical wellbeing, and total abstinence from substance use was recommended the patient.ed. ? ?# Prescriptions provided or sent directly to preferred pharmacy at discharge. Patient agreeable to plan. Given opportunity to ask questions. Appears to feel comfortable with discharge.  ?  ?# In the event of worsening symptoms, the patient is instructed to call the crisis hotline, 911 and or go to the nearest ED for appropriate evaluation and treatment of symptoms. To follow-up with primary care provider for other medical issues, concerns and or health care needs. ? ?#Patient seen by this MD. At time of discharge, consistently refuted any suicidal ideation, intention or plan, denies any Self harm urges. Denies any A/VH and no delusions were elicited and does not seem to be responding to internal stimuli.  Denies first rank symptoms and ideas of reference.  During assessment the patient is able to verbalize appropriated coping skills and safety plan to use on return home. Patient verbalizes intent to be compliant with medication  and outpatient services.  ? ?# Patient was [facility] to [facility] with a plan to follow up as noted below.  CSW to give a bus pass to patient.  Pt plan to go to [facility] to get ID.  ?Musculoskeletal: ?Strength & Muscle Tone: within normal limits ?Gait & Station: normal ?Patient leans: N/A ? ?Psychiatric Specialty Exam ? ?Presentation  ?General Appearance: Casual ? ?Eye Contact:Fair ? ?Speech:Clear and Coherent ? ?Speech Volume:Normal ? ?Handedness:Right ? ? ?Mood and Affect  ?Mood:Euthymic ? ?Duration of Depression Symptoms: Greater than two weeks ? ?Affect:Full Range ? ? ?Thought Process  ?Thought Processes:Goal Directed; Linear ? ?Descriptions of Associations:Intact ? ?Orientation:Full (Time, Place and Person) ? ?Thought Content:-- (Denies SI, HI, AVH) ? ?History of Schizophrenia/Schizoaffective disorder:Yes ? ?Duration of Psychotic Symptoms:Greater than six months ? ?Hallucinations:Hallucinations: None ?Description of Auditory Hallucinations: Denies ? ?Ideas of Reference:None ? ?Suicidal Thoughts:Suicidal Thoughts: No ?SI Active Intent and/or Plan: -- (Denies) ?SI Passive Intent and/or Plan: -- (Denies) ? ?Homicidal Thoughts:Homicidal Thoughts: No ?HI Active Intent and/or Plan: -- (denies) ? ? ?Sensorium  ?Memory:Immediate Fair; Recent Fair; Remote Fair ? ?Judgment:Fair ? ?Insight:Fair ? ? ?Executive Functions  ?Concentration:Good ? ?Attention Span:Good ? ?Recall:Fair ? ?Fund of Knowledge:Fair ? ?Language:Fair ? ? ?Psychomotor Activity  ?Psychomotor Activity:Psychomotor Activity: Normal ? ? ?Assets  ?Assets:Resilience; Physical Health; Desire for Improvement; Communication Skills ? ? ?Sleep  ?Sleep:Sleep: Good ?Number of Hours of Sleep: 7 ? ? ?Physical Exam: ?Physical Exam see d/c summary ?ROS see d/c summary ?Blood pressure 115/74, pulse 70, temperature 97.6 ?F (36.4 ?C), temperature source Oral, resp. rate 16, SpO2 100 %. There is no height or weight on file to calculate BMI. ? ?Mental Status Per Nursing  Assessment::   ?On Admission:  NA ?  Demographic factors:  Male ?Current Mental Status:  Denies SI, HI, AVH. Mood Stable ?Loss Factors:  NA ?Historical Factors:  h/o psychiatric diagnosis, Noncompliance with medications, D

## 2022-04-13 NOTE — Progress Notes (Signed)
Recreation Therapy Notes ? ?INPATIENT RECREATION TR PLAN ? ?Patient Details ?Name: Franciscojavier Wronski ?MRN: [id number] ?DOB: [birth year] ?Today's Date: 04/13/2022 ? ?Rec Therapy Plan ?Is patient appropriate for Therapeutic Recreation?: Yes ?Treatment times per week: about 3 days ?Estimated Length of Stay: 5-7 days ?TR Treatment/Interventions: Group participation (Comment) ? ?Discharge Criteria ?Pt will be discharged from therapy if:: Discharged ?Treatment plan/goals/alternatives discussed and agreed upon by:: Patient/family ? ?Discharge Summary ?Short term goals set: See patient care plan ?Short term goals met: Adequate for discharge ?Progress toward goals comments: Groups attended ?Which groups?: Other (Comment) Producer, television/film/video, Active Game) ?Reason goals not met: Pt didn't identify more triggers. ?Therapeutic equipment acquired: N/A ?Reason patient discharged from therapy: Discharge from hospital ?Pt/family agrees with progress & goals achieved: Yes ?Date patient discharged from therapy: 04/13/22 ? ?Victorino Sparrow, LRT,CTRS ?Victorino Sparrow A ?04/13/2022, 1:21 PM ?

## 2022-04-13 NOTE — Plan of Care (Signed)
Patients was able to name a few triggers during the course of recreation therapy group sessions. ? ? ?Caroll Rancher, LRT,CTRS ?

## 2022-04-13 NOTE — B[facility] Suicide Risk Assessment (Signed)
Patient refused to sign for any records to go to outpatient mental health follow up.  CSW put walk in hours for the [facility] to assist with therapy and med management.  ? ? ?Devon Pretty, LCSW, LCAS ?Clincal Social Worker  ?[facility] ? ?

## 2022-04-13 NOTE — Progress Notes (Signed)
?  [facility] Adult Case Management Discharge Plan : ? ?Will you be returning to the same living situation after discharge:  Yes,  shelter, [facility] ?At discharge, do you have transportation home?: yes, bus tickets ?Do you have the ability to pay for your medications: Yes,  provided resources for affordable medications.  ? ?Release of information consent forms completed and in the chart;  Patient's signature needed at discharge. ? ?Patient to Follow up at: ? Follow-up Information   ? ? [facility]. Go to.   ?Specialty: Behavioral Health ?Why: Please go to this provider for outpatient therapy and medication management services on walk in days:  Mondays and Wednesdays;  Arrive at 7:30 am.  Services are provided on a first come, first served basis. ?Contact information: ?[address] ?[place] [address] ?[phone] ? ?  ?  ? ?  ?  ? ?  ? ? ?Next level of care provider has access to [facility] Link:yes ? ?Safety Planning and Suicide Prevention discussed: Yes, with patient, patient declined consent to speak with social supports  ? ?  ? ?Has patient been referred to the Quitline?: Patient refused referral ? ?Patient has been referred for addiction treatment: Pt. refused referral ? ?Seeley Hissong E Joya Willmott, LCSW ?04/13/2022, 10:01 AM ?

## 2022-04-13 NOTE — Group Note (Signed)
[place] LCSW Group Therapy Note ? ? ?Group Date: 04/13/2022 ?Start Time: 1100 ?End Time: 1200 ? ? ?Type of Therapy/Topic:  Group Therapy:  Emotion Regulation ? ?Participation Level:  None  ? ? ? ?Description of Group:   ? The purpose of this group is to assist patients in learning to regulate negative emotions and experience positive emotions. Patients will be guided to discuss ways in which they have been vulnerable to their negative emotions. These vulnerabilities will be juxtaposed with experiences of positive emotions or situations, and patients challenged to use positive emotions to combat negative ones. Special emphasis will be placed on coping with negative emotions in conflict situations, and patients will process healthy conflict resolution skills. ? ?Therapeutic Goals: ?Patient will identify two positive emotions or experiences to reflect on in order to balance out negative emotions:  ?Patient will label two or more emotions that they find the most difficult to experience:  ?Patient will be able to demonstrate positive conflict resolution skills through discussion or role plays:  ? ?Summary of Patient Progress: Patient came to group initially however, left near the beginning ? ? ? ?Therapeutic Modalities:   ?Cognitive Behavioral Therapy ?Feelings Identification ?Dialectical Behavioral Therapy ? ? ?Vassie Moselle, LCSW ?

## 2022-04-13 NOTE — Telephone Encounter (Signed)
2nd attempt, Patient called, left VM to return the call to the office to discuss symptoms with a nurse.  

## 2022-04-13 NOTE — Telephone Encounter (Signed)
? ?  Summary: Slurred words and staggered steps  ? Pt called reporting symptoms today following treatment yesterday, slurred words and staggered steps. Please advise  ? ?Best contact: [phone]   ?  ? ?

## 2022-04-13 NOTE — Group Note (Signed)
Recreation Therapy Group Note ? ? ?Group Topic:Leisure Education  ?Group Date: 04/13/2022 ?Start Time: 1006 ?End Time: 1050 ?Facilitators: Caroll Rancher, LRT,CTRS ?Location: [address] Dayroom ? ? ?Goal Area(s) Addresses:  ?Patient will successfully identify benefits of leisure participation. ?Patient will successfully identify ways to access leisure activities. ?  ?Group Description:  Patients and LRT went over the meaning of leisure and its benefits. Patients were then instructed to create a PSA that explains leisure to your everyday individual.  Patients were given construction paper, glue sticks, magazines, scissors and markers to create their ad.  In addition to explaining leisure and its benefits, patients were to include where leisure could happen and give examples.  Patients were debriefed on being active in leisure, and also how leisure can still provide you with lessons to help you learn. ? ? ?Affect/Mood: N/A ?  ?Participation Level: Did not attend ?  ? ?Clinical Observations/Individualized Feedback:  ? ? ?Plan: Continue to engage patient in RT group sessions 2-3x/week. ? ? ?Caroll Rancher, LRT,CTRS ?04/13/2022 12:56 PM ?

## 2022-04-13 NOTE — Discharge Summary (Signed)
Physician Discharge Summary Note ? ?Patient:  Rickey Miller is an 31 y.o., male ?MRN:  [id number] ?DOB:  [birth year] ?Patient phone:  [phone] (home)  ?Patient address:   ?Homeless ?[place] [address],  ?Total Time spent with patient: 30 minutes ? ?Date of Admission:  04/09/2022 ?Date of Discharge: 04/13/22 ?Reason for Admission:   Patient is a 31 year old male with past psychiatric history of schizoaffective disorder bipolar type, polysubstance abuse, cocaine abuse, cannabis abuse, social anxiety disorder, GAD, nicotine dependence and alcohol use disorder who initially presented to [facility] ED on 5/3 due to worsening depression and SI with a plan.  Patient was transferred to [facility] where he remained in the observation unit and was transferred later to [facility]  adult unit on 5/8 /2023.His most recent [facility] admission from 03/20/2022 to 03/26/2022 due to Seroquel overdose.  ?  ? ?Principal Problem: Schizoaffective disorder (HCC) ?Discharge Diagnoses: Principal Problem: ?  Schizoaffective disorder (HCC) ?Active Problems: ?  Cocaine abuse, episodic (HCC) ?  Cannabis abuse ?  GAD (generalized anxiety disorder) ?  Nicotine dependence ?  Alcohol use disorder ?  PTSD (post-traumatic stress disorder) ?  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  Opiate abuse, episodic (HCC) ? ? ?Past Psychiatric History: : He reports 3 previous suicidal attempts-Most recent in April 2023, once when he was 75-years old by overdosing and in 2021 by overdosing on heroin.  ?His most recent [facility] admission from 03/20/2022 to 03/26/2022 due to Seroquel overdose.  He was discharged on Zyprexa 5 mg in the morning and 10 mg at night, thiamine 100 mg daily, prazosin 1 mg nightly for nightmares, and Intuniv 1 mg p.o. daily for irritability.  He reports that he took medication for 2 days after last discharge and then stopped.  ?He reports 4 previous hospitalizations, 1 recent admission at [facility] in April 2023, 2 at [facility] and 1 in [place]. ?He reports  that he has a diagnosis of PTSD, bipolar and schizophrenia which was diagnosed when he was younger.  He reports that he was in prison for 7 years for robbery charges and got out in November.  He had been on Seroquel, Remeron, Abilify, BuSpar, and Strattera in the past.  He stopped medications after coming out of prison and reports being on VPA in Prison - was on Zyprexa, Intuniv, and Prazosin at last hospital discharge. ?  ? ?Past Medical History: History reviewed. No pertinent past medical history.  ?Past Surgical History:  ?Procedure Laterality Date  ? TONSILLECTOMY    ? ?Family History: History reviewed. No pertinent family history. ?Family Psychiatric  History:  Mom and Brother-both take psych medications for unknown reasons ?No suicides in family ?Social History:  ?Social History  ? ?Substance and Sexual Activity  ?Alcohol Use Yes  ? Comment: Friday, Sat  ?   ?Social History  ? ?Substance and Sexual Activity  ?Drug Use Yes  ? Types: Marijuana  ? Comment: weekly; unable to quantify  ?  ?Social History  ? ?Socioeconomic History  ? Marital status: Unknown  ?  Spouse name: Not on file  ? Number of children: Not on file  ? Years of education: Not on file  ? Highest education level: Not on file  ?Occupational History  ? Not on file  ?Tobacco Use  ? Smoking status: Every Day  ?  Packs/day: 1.00  ?  Years: 13.00  ?  Pack years: 13.00  ?  Types: Cigarettes  ? Smokeless tobacco: Never  ?Vaping Use  ? Vaping  Use: Former  ?Substance and Sexual Activity  ? Alcohol use: Yes  ?  Comment: Friday, Sat  ? Drug use: Yes  ?  Types: Marijuana  ?  Comment: weekly; unable to quantify  ? Sexual activity: Yes  ?  Birth control/protection: None  ?Other Topics Concern  ? Not on file  ?Social History Narrative  ? Not on file  ? ?Social Determinants of Health  ? ?Financial Resource Strain: Not on file  ?Food Insecurity: Not on file  ?Transportation Needs: Not on file  ?Physical Activity: Not on file  ?Stress: Not on file  ?Social  Connections: Not on file  ? ? ?Hospital Course:  HOSPITAL COURSE: ?  ?During the patient's hospitalization, patient had extensive initial psychiatric evaluation, and follow-up psychiatric evaluations every day. ?  ?Psychiatric diagnoses provided upon initial assessment:  ?Schizoaffective disorder (HCC) ?  Polysubstance abuse (HCC) ?  Cannabis abuse ?  GAD (generalized anxiety disorder) ?  Nicotine dependence ?  Alcohol use disorder ?  PTSD (post-traumatic stress disorder) ?Patient's psychiatric medications were adjusted on admission:  ?-Decreased Zyprexa  5 mg BID and start tapering off med. ?-Started Haldol 5 mg twice daily for psychosis. (R/b/se/a discussed and patient agrees with medication trial).  Plan for LAI.  ?-Continued Minipress 1 mg QHS for PTSD ?-Discontinued Intuniv. ?-Continued agitation protocol with Zyprexa /Geodon ?-Continued Cogentin 1 mg twice daily as needed for EPS prevention ?- Started COWS and CIWA monitoring with as needed Ativan ?-Continued thiamine 100 mg daily. ?-Continued multivitamin with minerals daily. ?-Continued Zofran 4 mg every 6 hours as needed for nausea or vomiting. ?-Continued Imodium 2 to 4 mg as needed for diarrhea or loose stools for 72 hours.  ? -Continue Nicotine Gum as needed ?  ?During the hospitalization, other adjustments were made to the patient's psychiatric medication regimen:  ?-Zyprexa tapered and stopped ?-Continued Haldol 5 mg twice daily for psychosis. (R/b/se/a discussed and patient agrees with medication trial).  ?-Pt received Haldol decanoate LAI 100 mg on 5/12 and Q30days.Next Haldol decanoate due on 05/13/22. ?  ?-Prazosin discontinued per patient's request. ?-Changed trazodone to 100 mg qhs scheduled for insomnia ?-Intuniv discontinued. ?-Started Cogentin 1 mg twice daily as needed for EPS prevention. ?  ?Patient's care was discussed during the interdisciplinary team meeting every day during the hospitalization. ?  ?-The risks of potentially developing  TD/EPS, weight gain, blood dyscrasias, EKG changes, DM, and high cholesterol while taking an antipsychotic were discussed in detail with the patient. The patient verbalized understanding of discussed risks and agreed to medication trial. The patient was made aware of the need for ongoing lab, AIMS, EKG, and weight monitoring with use of an antipsychotic.   ?The patient denies having side effects to prescribed psychiatric medication. ?  ?Gradually, patient started adjusting to milieu. The patient was evaluated each day by a clinical provider to ascertain response to treatment. Improvement was noted by the patient's report of decreasing symptoms, improved sleep and appetite, affect, medication tolerance, behavior, and participation in unit programming.  Patient was asked each day to complete a self inventory noting mood, mental status, pain, new symptoms, anxiety and concerns.   ?  ?Symptoms were reported as significantly decreased or resolved completely by discharge.  ?  ?On day of discharge, the patient reports that their mood is stable. The patient denied having suicidal thoughts for more than 48 hours prior to discharge.  Patient denies having homicidal thoughts.  Patient denies having auditory hallucinations.  Patient denies any visual  hallucinations or other symptoms of psychosis. The patient was motivated to continue taking medication with a goal of continued improvement in mental health.  ?  ?The patient reports their target psychiatric symptoms of Schizoaffective disorder responded well to the psychiatric medications, and the patient reports overall benefit other psychiatric hospitalization. Supportive psychotherapy was provided to the patient. The patient also participated in regular group therapy while hospitalized. Coping skills, problem solving as well as relaxation therapies were also part of the unit programming. ?  ?Labs were reviewed with the patient, and abnormal results were discussed with the  patient. ?  ?The patient is able to verbalize their individual safety plan to this provider. ?  ?# It is recommended to the patient to continue psychiatric medications as prescribed, after discharge from the hospital.

## 2022-04-13 NOTE — Progress Notes (Signed)
Patient discharged to home/self care on his own accord. Patient denies SI, HI and AVH upon discharge. Patient acknowledged understanding of all discharge instructions and receipt of personal belongings.  ?

## 2022-04-13 NOTE — [facility] Suicide Risk Assessment (Signed)
[facility] INPATIENT:  Family/Significant Other Suicide Prevention Education ? ?Suicide Prevention Education:  ?Patient Refusal for Family/Significant Other Suicide Prevention Education: The patient Rickey Miller has refused to provide written consent for family/significant other to be provided Family/Significant Other Suicide Prevention Education during admission and/or prior to discharge.  Physician notified. ? ?Lyrik Dockstader E Vang Kraeger ?04/13/2022, 10:00 AM ?

## 2022-04-15 ENCOUNTER — Encounter: Payer: Self-pay | Admitting: Psychiatry

## 2022-04-19 ENCOUNTER — Encounter (HOSPITAL_COMMUNITY): Payer: Self-pay

## 2022-04-19 ENCOUNTER — Other Ambulatory Visit: Payer: Self-pay

## 2022-04-19 ENCOUNTER — Emergency Department (HOSPITAL_COMMUNITY)
Admission: EM | Admit: 2022-04-19 | Discharge: 2022-04-19 | Disposition: A | Discharge status: Home / Self Care | Payer: No Typology Code available for payment source | Attending: Emergency Medicine | Admitting: Emergency Medicine

## 2022-04-19 ENCOUNTER — Emergency Department (HOSPITAL_COMMUNITY): Payer: No Typology Code available for payment source

## 2022-04-19 DIAGNOSIS — Z79899 Other long term (current) drug therapy: Secondary | ICD-10-CM | POA: Diagnosis not present

## 2022-04-19 DIAGNOSIS — R112 Nausea with vomiting, unspecified: Secondary | ICD-10-CM | POA: Insufficient documentation

## 2022-04-19 DIAGNOSIS — R1033 Periumbilical pain: Secondary | ICD-10-CM | POA: Insufficient documentation

## 2022-04-19 HISTORY — DX: Schizophrenia, unspecified: F20.9

## 2022-04-19 HISTORY — DX: Bipolar disorder, unspecified: F31.9

## 2022-04-19 LAB — CBC WITH DIFFERENTIAL/PLATELET
Abs Immature Granulocytes: 0.01 10*3/uL (ref 0.00–0.07)
Basophils Absolute: 0 10*3/uL (ref 0.0–0.1)
Basophils Relative: 1 %
Eosinophils Absolute: 0.1 10*3/uL (ref 0.0–0.5)
Eosinophils Relative: 1 %
HCT: 44 % (ref 39.0–52.0)
Hemoglobin: 14.7 g/dL (ref 13.0–17.0)
Immature Granulocytes: 0 %
Lymphocytes Relative: 17 %
Lymphs Abs: 1.2 10*3/uL (ref 0.7–4.0)
MCH: 29.2 pg (ref 26.0–34.0)
MCHC: 33.4 g/dL (ref 30.0–36.0)
MCV: 87.3 fL (ref 80.0–100.0)
Monocytes Absolute: 0.9 10*3/uL (ref 0.1–1.0)
Monocytes Relative: 12 %
Neutro Abs: 5.2 10*3/uL (ref 1.7–7.7)
Neutrophils Relative %: 69 %
Platelets: 197 10*3/uL (ref 150–400)
RBC: 5.04 MIL/uL (ref 4.22–5.81)
RDW: 11.6 % (ref 11.5–15.5)
WBC: 7.4 10*3/uL (ref 4.0–10.5)
nRBC: 0 % (ref 0.0–0.2)

## 2022-04-19 LAB — COMPREHENSIVE METABOLIC PANEL
ALT: 29 U/L (ref 0–44)
AST: 25 U/L (ref 15–41)
Albumin: 3.9 g/dL (ref 3.5–5.0)
Alkaline Phosphatase: 25 U/L — ABNORMAL LOW (ref 38–126)
Anion gap: 7 (ref 5–15)
BUN: 10 mg/dL (ref 6–20)
CO2: 23 mmol/L (ref 22–32)
Calcium: 9.2 mg/dL (ref 8.9–10.3)
Chloride: 107 mmol/L (ref 98–111)
Creatinine, Ser: 1.16 mg/dL (ref 0.61–1.24)
GFR, Estimated: >60 mL/min (ref >60)
Glucose, Bld: 105 mg/dL — ABNORMAL HIGH (ref 70–99)
Potassium: 3.5 mmol/L (ref 3.5–5.1)
Sodium: 137 mmol/L (ref 135–145)
Total Bilirubin: 0.8 mg/dL (ref 0.3–1.2)
Total Protein: 6.3 g/dL — ABNORMAL LOW (ref 6.5–8.1)

## 2022-04-19 LAB — URINALYSIS, ROUTINE W REFLEX MICROSCOPIC
Bilirubin Urine: NEGATIVE
Glucose, UA: NEGATIVE mg/dL
Hgb urine dipstick: NEGATIVE
Ketones, ur: NEGATIVE mg/dL
Leukocytes,Ua: NEGATIVE
Nitrite: NEGATIVE
Protein, ur: NEGATIVE mg/dL
Specific Gravity, Urine: 1.008 (ref 1.005–1.030)
pH: 6 (ref 5.0–8.0)

## 2022-04-19 LAB — RAPID URINE DRUG SCREEN, HOSP PERFORMED
Amphetamines: POSITIVE — AB
Barbiturates: NOT DETECTED
Benzodiazepines: NOT DETECTED
Cocaine: NOT DETECTED
Opiates: POSITIVE — AB
Tetrahydrocannabinol: POSITIVE — AB

## 2022-04-19 LAB — LIPASE, BLOOD: Lipase: 28 U/L (ref 11–51)

## 2022-04-19 LAB — TROPONIN I (HIGH SENSITIVITY): Troponin I (High Sensitivity): 3 ng/L (ref <18)

## 2022-04-19 LAB — ETHANOL: Alcohol, Ethyl (B): <10 mg/dL (ref <10)

## 2022-04-19 LAB — SALICYLATE LEVEL: Salicylate Lvl: <7 mg/dL — ABNORMAL LOW (ref 7.0–30.0)

## 2022-04-19 LAB — ACETAMINOPHEN LEVEL: Acetaminophen (Tylenol), Serum: <10 ug/mL — ABNORMAL LOW (ref 10–30)

## 2022-04-19 MED ORDER — IOHEXOL 300 MG/ML  SOLN
100.0000 mL | Freq: Once | INTRAMUSCULAR | Status: AC | PRN
  Administered 2022-04-19: 100 mL via INTRAVENOUS

## 2022-04-19 MED ORDER — LACTATED RINGERS IV BOLUS
1000.0000 mL | Freq: Once | INTRAVENOUS | Status: AC
  Administered 2022-04-19: 1000 mL via INTRAVENOUS

## 2022-04-19 MED ORDER — MORPHINE SULFATE (PF) 4 MG/ML IV SOLN
4.0000 mg | Freq: Once | INTRAVENOUS | Status: AC
  Administered 2022-04-19: 4 mg via INTRAVENOUS
  Filled 2022-04-19: qty 1

## 2022-04-19 MED ORDER — ONDANSETRON HCL 4 MG PO TABS: 4.0000 mg | ORAL_TABLET | Freq: Four times a day (QID) | ORAL | 0 refills | Status: AC

## 2022-04-19 MED ORDER — DICYCLOMINE HCL 20 MG PO TABS: 20.0000 mg | ORAL_TABLET | Freq: Two times a day (BID) | ORAL | 0 refills | Status: AC

## 2022-04-19 MED ORDER — ONDANSETRON HCL 4 MG/2ML IJ SOLN
4.0000 mg | Freq: Once | INTRAMUSCULAR | Status: AC
  Administered 2022-04-19: 4 mg via INTRAVENOUS
  Filled 2022-04-19: qty 2

## 2022-04-19 NOTE — Discharge Instructions (Addendum)
You are seen in the emergency department today for abdominal pain.  You likely have a viral gastroenteritis which is causing your nausea and vomiting.  I have prescribed you Zofran as well as Bentyl which may help your symptoms.  Please continue to drink plenty of fluids and eat a bland diet over the next few days.  Please return for worsening symptoms including abdominal pain with fever.

## 2022-04-19 NOTE — ED Notes (Signed)
Patient to ER room 30 via EMS. Patient reports LLQ abdominal pain that began last night. Patient denies vomiting but reports some nausea. Patient reports LBM was yesterday and was of normal color consistency. Patient is alert and oriented and calm. Call bell in reach.

## 2022-04-19 NOTE — ED Notes (Signed)
Pt is requesting more pain meds at this time. Denies nausea. Provider notified.

## 2022-04-19 NOTE — ED Notes (Signed)
Pt passed PO challenge with ginger ale. Cleared for discharge. Pt was provided with bus pass.

## 2022-04-19 NOTE — ED Provider Notes (Signed)
Care of patient handed off to me at this time from [employer], [place].  Please see her note for full work-up to this point.  Briefly, this is a 31 year old male with history of schizophrenia, substance abuse who presents to the emergency department with abdominal pain since last night at 9 PM.  He has had 3 episodes of nonbloody vomiting.  No diarrhea.  Last bowel movement yesterday.  No urinary symptoms, fever or chills. Physical Exam  BP 120/80   Pulse 70   Temp 98.4 F (36.9 C) (Oral)   Ht 5\' 7"  (1.702 m)   Wt 81.6 kg   SpO2 98%   BMI 28.19 kg/m   Physical Exam Vitals and nursing note reviewed.  Constitutional:      General: He is not in acute distress.    Appearance: He is well-developed. He is ill-appearing. He is not toxic-appearing.  HENT:     Head: Normocephalic and atraumatic.  Eyes:     General: No scleral icterus.    Extraocular Movements: Extraocular movements intact.  Cardiovascular:     Heart sounds: Normal heart sounds. No murmur heard. Pulmonary:     Effort: Pulmonary effort is normal. No respiratory distress.  Abdominal:     General: Abdomen is flat. Bowel sounds are normal. There is no distension.     Palpations: Abdomen is soft.     Tenderness: There is abdominal tenderness in the epigastric area and left lower quadrant. There is guarding. There is no rebound.     Hernia: No hernia is present.  Skin:    General: Skin is warm and dry.     Capillary Refill: Capillary refill takes less than 2 seconds.     Findings: No rash.  Neurological:     General: No focal deficit present.     Mental Status: He is alert and oriented to person, place, and time.  Psychiatric:        Mood and Affect: Mood normal.        Behavior: Behavior normal.        Thought Content: Thought content normal.        Judgment: Judgment normal.    Procedures  Procedures  ED Course / MDM    Medical Decision Making Amount and/or Complexity of Data Reviewed Labs: ordered. Radiology:  ordered.  Risk Prescription drug management.  31 year old male who presents emergency department with abdominal pain and vomiting. Physical exam notable for her periumbilical and left lower quadrant abdominal tenderness to palpation.  No rebound tenderness. Labs CMP, CBC, UA, lipase, troponin, salicylates, ethanol, acetaminophen all negative Given fluids, Zofran and morphine with improvement in nausea.  He does have ongoing pain.  He was redosed with morphine.  Given his ongoing pain symptoms and periumbilical abdominal pain, will obtain imaging to rule out appendicitis or other etiologies of his symptoms. -CT A/P with contrast shows no acute intra-abdominal pathology. After work-up, work-up and consistent with acute hepatobiliary disease, pancreatitis, appendicitis, diverticulitis, enteritis/colitis, perforated ulcer, viscus perforation, bowel obstruction.  Work-up also inconsistent with torsion, UTI, pyelonephritis, epididymitis, orchitis. Patient likely has a viral gastroenteritis causing his symptoms.  We will prescribe him Zofran and Bentyl for his symptoms.  Given instructions to drink plenty of fluids as well as eat a bland diet over the next few days.  He verbalized understanding.  Is safe for discharge at this time.  Consider admission for pain control however feel that patient is stable with no emergent intra-abdominal pathology requiring him to be admitted at  this time.  Discharged with return precautions.       Cristopher Peru, PA-C 04/19/22 0935    Melene Plan, DO 04/19/22 [phone]

## 2022-04-19 NOTE — ED Provider Notes (Signed)
[facility] EMERGENCY DEPARTMENT Provider Note   CSN: [id number] Arrival date & time: 04/19/22  9528     History  Chief Complaint  Patient presents with   Abdominal Pain    Lower abdominal pain that began last night. Patient reports intermittent nausea.    Rickey Miller is a 31 y.o. male With history of schizophrenia Zentz with concern for left-sided and lower abdominal pain since 9 PM last night with associated nausea and vomiting with NBNB emesis x3 without diarrhea.  States he has not passed any gas since onset of his pain either.  Last bowel movement was yesterday.  Denies any urinary symptoms, fevers, or chills but states he has not been able to sleep all night secondary to the pain.  Describes the pain as sharp and intermittently cramping.  I have personally reviewed this patient's medical records.  Has history of polysubstance abuse in addition to his schizoaffective disorder/schizophrenia.  Was recently admitted to behavioral health for suicidal ideation with plan.  HPI     Home Medications Prior to Admission medications   Medication Sig Start Date End Date Taking? Authorizing Provider  benztropine (COGENTIN) 1 MG tablet Take 1 tablet (1 mg total) by mouth 2 (two) times daily. 04/13/22 05/13/22  Karsten Ro, MD  haloperidol (HALDOL) 5 MG tablet Take 1 tablet (5 mg total) by mouth 2 (two) times daily. 04/13/22 05/13/22  Karsten Ro, MD  hydrOXYzine (ATARAX) 25 MG tablet Take 1 tablet (25 mg total) by mouth 3 (three) times daily as needed for anxiety. 03/26/22   Karsten Ro, MD  Multiple Vitamin (MULTIVITAMIN WITH MINERALS) TABS tablet Take 1 tablet by mouth daily. 04/14/22 05/14/22  Karsten Ro, MD  nicotine polacrilex (NICORETTE) 2 MG gum Take 1 each (2 mg total) by mouth as needed for smoking cessation. 04/13/22   Karsten Ro, MD  thiamine 100 MG tablet Take 1 tablet (100 mg total) by mouth daily. 04/13/22   Karsten Ro, MD  traZODone (DESYREL) 100 MG  tablet Take 1 tablet (100 mg total) by mouth at bedtime. 04/13/22 05/13/22  Karsten Ro, MD      Allergies    Patient has no known allergies.    Review of Systems   Review of Systems  Constitutional:  Positive for appetite change.  Gastrointestinal:  Positive for abdominal pain, nausea and vomiting.  Genitourinary: Negative.   Psychiatric/Behavioral:  Positive for sleep disturbance.    Physical Exam Updated Vital Signs BP 129/83 (BP Location: Right Arm)   Pulse 79   Temp 98.4 F (36.9 C) (Oral)   Ht 5\' 7"  (1.702 m)   Wt 81.6 kg   SpO2 99%   BMI 28.19 kg/m  Physical Exam Vitals and nursing note reviewed.  Constitutional:      Appearance: He is obese. He is not ill-appearing or toxic-appearing.  HENT:     Head: Normocephalic and atraumatic.     Mouth/Throat:     Mouth: Mucous membranes are moist.     Pharynx: No oropharyngeal exudate or posterior oropharyngeal erythema.  Eyes:     General:        Right eye: No discharge.        Left eye: No discharge.     Extraocular Movements: Extraocular movements intact.     Conjunctiva/sclera: Conjunctivae normal.     Pupils: Pupils are equal, round, and reactive to light.  Cardiovascular:     Rate and Rhythm: Normal rate and regular rhythm.     Pulses: Normal  pulses.     Heart sounds: Normal heart sounds. No murmur heard. Pulmonary:     Effort: Pulmonary effort is normal. No respiratory distress.     Breath sounds: Normal breath sounds. No wheezing or rales.  Abdominal:     General: Bowel sounds are normal. There is no distension.     Palpations: Abdomen is soft.     Tenderness: There is abdominal tenderness in the epigastric area, periumbilical area, suprapubic area and left lower quadrant. Negative signs include Murphy's sign, Rovsing's sign and McBurney's sign.  Musculoskeletal:        General: No deformity.     Cervical back: Neck supple.  Skin:    General: Skin is warm and dry.     Capillary Refill: Capillary refill  takes less than 2 seconds.  Neurological:     General: No focal deficit present.     Mental Status: He is alert and oriented to person, place, and time. Mental status is at baseline.  Psychiatric:        Mood and Affect: Mood normal.    ED Results / Procedures / Treatments   Labs (all labs ordered are listed, but only abnormal results are displayed) Labs Reviewed - No data to display  EKG None  Radiology No results found.  Procedures Procedures    Medications Ordered in ED Medications - No data to display  ED Course/ Medical Decision Making/ A&P                           Medical Decision Making 31 year old male who presents with concern for abdominal pain with associated nausea and vomiting over the last 9 hours.  Vital signs otherwise normal. Cardiopulmonary exam is normal, abdominal exam with generalized TTP, no CVAT. Neurovascularly intact in all extremities. Belching throughout exam.   Differential for this patient symptoms includes but is not limited to foodborne illness/gastroenteritis, bowel obstruction, diverticulitis, UTI, nephrolithiasis, appendicitis, psoas abscess.   Will obtain ingestion labs as well given history of suicidality with plan.   Amount and/or Complexity of Data Reviewed Labs: ordered.    Details: CBC unremarkable, remainder of laboratory studies pending at time of shift change.  Risk Prescription drug management.   Care of this patient signed out to oncoming ED provider Mertha Baars, PA-C at time of shift change.  All pertinent HPI, physical exam, and laboratory findings were discussed with her prior to my departure.  We will likely recommend proceeding with CT of the abdomen pelvis given degree of patient's tenderness to palpation.   Saurav voiced understanding of his medical evaluation and treatment plan thus far.  Each of his questions was answered to his expressed satisfaction.  This chart was dictated using voice recognition  software, Dragon. Despite the best efforts of this provider to proofread and correct errors, errors may still occur which can change documentation meaning.    Final Clinical Impression(s) / ED Diagnoses Final diagnoses:  None    Rx / DC Orders ED Discharge Orders     None         Paris Lore, PA-C 04/19/22 [id number]    Sabas Sous, MD 04/19/22 [phone]

## 2022-04-19 NOTE — ED Triage Notes (Signed)
Lower abdominal pain and nausea since last night.

## 2022-04-25 ENCOUNTER — Telehealth (HOSPITAL_COMMUNITY): Payer: Self-pay

## 2022-04-25 NOTE — BH Assessment (Signed)
Care Management - Follow Up [facility] Discharges   Patient has been placed in an inpatient psychiatric hospital ([facility]) on  04-09-2022.

## 2022-06-07 ENCOUNTER — Emergency Department (HOSPITAL_COMMUNITY)
Admission: EM | Admit: 2022-06-07 | Discharge: 2022-06-11 | Disposition: A | Discharge status: Other Acute Inpatient Hospital | Payer: No Typology Code available for payment source | Attending: Emergency Medicine | Admitting: Emergency Medicine

## 2022-06-07 ENCOUNTER — Encounter (HOSPITAL_COMMUNITY): Payer: Self-pay

## 2022-06-07 DIAGNOSIS — Z20822 Contact with and (suspected) exposure to covid-19: Secondary | ICD-10-CM | POA: Diagnosis not present

## 2022-06-07 DIAGNOSIS — F411 Generalized anxiety disorder: Secondary | ICD-10-CM | POA: Diagnosis not present

## 2022-06-07 DIAGNOSIS — X838XXA Intentional self-harm by other specified means, initial encounter: Secondary | ICD-10-CM | POA: Insufficient documentation

## 2022-06-07 DIAGNOSIS — Y900 Blood alcohol level of less than 20 mg/100 ml: Secondary | ICD-10-CM | POA: Diagnosis not present

## 2022-06-07 DIAGNOSIS — W19XXXA Unspecified fall, initial encounter: Secondary | ICD-10-CM | POA: Diagnosis not present

## 2022-06-07 DIAGNOSIS — F250 Schizoaffective disorder, bipolar type: Secondary | ICD-10-CM | POA: Insufficient documentation

## 2022-06-07 DIAGNOSIS — Z79899 Other long term (current) drug therapy: Secondary | ICD-10-CM | POA: Insufficient documentation

## 2022-06-07 DIAGNOSIS — Z9189 Other specified personal risk factors, not elsewhere classified: Secondary | ICD-10-CM

## 2022-06-07 DIAGNOSIS — F259 Schizoaffective disorder, unspecified: Secondary | ICD-10-CM | POA: Diagnosis present

## 2022-06-07 DIAGNOSIS — Z043 Encounter for examination and observation following other accident: Secondary | ICD-10-CM | POA: Diagnosis present

## 2022-06-07 DIAGNOSIS — T1491XA Suicide attempt, initial encounter: Secondary | ICD-10-CM

## 2022-06-07 LAB — COMPREHENSIVE METABOLIC PANEL
ALT: 17 U/L (ref 0–44)
AST: 23 U/L (ref 15–41)
Albumin: 3.6 g/dL (ref 3.5–5.0)
Alkaline Phosphatase: 23 U/L — ABNORMAL LOW (ref 38–126)
Anion gap: 8 (ref 5–15)
BUN: 8 mg/dL (ref 6–20)
CO2: 22 mmol/L (ref 22–32)
Calcium: 9.3 mg/dL (ref 8.9–10.3)
Chloride: 112 mmol/L — ABNORMAL HIGH (ref 98–111)
Creatinine, Ser: 1.3 mg/dL — ABNORMAL HIGH (ref 0.61–1.24)
GFR, Estimated: >60 mL/min (ref >60)
Glucose, Bld: 92 mg/dL (ref 70–99)
Potassium: 4 mmol/L (ref 3.5–5.1)
Sodium: 142 mmol/L (ref 135–145)
Total Bilirubin: 1.1 mg/dL (ref 0.3–1.2)
Total Protein: 5.5 g/dL — ABNORMAL LOW (ref 6.5–8.1)

## 2022-06-07 LAB — CBC WITH DIFFERENTIAL/PLATELET
Abs Immature Granulocytes: 0.04 10*3/uL (ref 0.00–0.07)
Basophils Absolute: 0 10*3/uL (ref 0.0–0.1)
Basophils Relative: 0 %
Eosinophils Absolute: 0 10*3/uL (ref 0.0–0.5)
Eosinophils Relative: 0 %
HCT: 42.7 % (ref 39.0–52.0)
Hemoglobin: 14.7 g/dL (ref 13.0–17.0)
Immature Granulocytes: 1 %
Lymphocytes Relative: 16 %
Lymphs Abs: 1.2 10*3/uL (ref 0.7–4.0)
MCH: 29.4 pg (ref 26.0–34.0)
MCHC: 34.4 g/dL (ref 30.0–36.0)
MCV: 85.4 fL (ref 80.0–100.0)
Monocytes Absolute: 0.5 10*3/uL (ref 0.1–1.0)
Monocytes Relative: 7 %
Neutro Abs: 5.8 10*3/uL (ref 1.7–7.7)
Neutrophils Relative %: 76 %
Platelets: 255 10*3/uL (ref 150–400)
RBC: 5 MIL/uL (ref 4.22–5.81)
RDW: 12.8 % (ref 11.5–15.5)
WBC: 7.6 10*3/uL (ref 4.0–10.5)
nRBC: 0 % (ref 0.0–0.2)

## 2022-06-07 LAB — RESP PANEL BY RT-PCR (FLU A&B, COVID) ARPGX2
Influenza A by PCR: NEGATIVE
Influenza B by PCR: NEGATIVE
SARS Coronavirus 2 by RT PCR: NEGATIVE

## 2022-06-07 LAB — ETHANOL: Alcohol, Ethyl (B): <10 mg/dL (ref <10)

## 2022-06-07 LAB — SALICYLATE LEVEL: Salicylate Lvl: <7 mg/dL — ABNORMAL LOW (ref 7.0–30.0)

## 2022-06-07 LAB — ACETAMINOPHEN LEVEL: Acetaminophen (Tylenol), Serum: <10 ug/mL — ABNORMAL LOW (ref 10–30)

## 2022-06-07 MED ORDER — TRIPLE ANTIBIOTIC 3.5-400-5000 EX OINT
1.0000 | TOPICAL_OINTMENT | Freq: Every day | CUTANEOUS | Status: AC
  Administered 2022-06-08 – 2022-06-10 (×3): 1 via CUTANEOUS
  Filled 2022-06-07 (×4): qty 1

## 2022-06-07 MED ORDER — LORAZEPAM 1 MG PO TABS: 1.0000 mg | ORAL_TABLET | ORAL | Status: DC | PRN

## 2022-06-07 MED ORDER — ZIPRASIDONE MESYLATE 20 MG IM SOLR
20.0000 mg | INTRAMUSCULAR | Status: DC | PRN
Start: 2022-06-07 — End: 2022-06-11

## 2022-06-07 MED ORDER — ACETAMINOPHEN 325 MG PO TABS: 650.0000 mg | ORAL_TABLET | ORAL | Status: DC | PRN

## 2022-06-07 MED ORDER — RISPERIDONE 1 MG PO TBDP: 2.0000 mg | ORAL_TABLET | Freq: Three times a day (TID) | ORAL | Status: DC | PRN

## 2022-06-07 NOTE — ED Notes (Signed)
Pt out to desk asking to leave, process explained to pt. Pt cooperative and returned to room.

## 2022-06-07 NOTE — ED Triage Notes (Signed)
Pt arrives after been seen running in traffic today with [employer] and EMS. Pt follows commands, but is not responding to questions from myself or EMS, nor [employer]. Pt has hx of schizoaffective.   [facility] papers are en route.

## 2022-06-07 NOTE — ED Notes (Signed)
Pt making first phone call . Calm at nurses station

## 2022-06-07 NOTE — ED Notes (Addendum)
Pt is cooperative getting into purple scrubs, clothing at nurses station.

## 2022-06-07 NOTE — ED Provider Notes (Signed)
[facility] EMERGENCY DEPARTMENT Provider Note   CSN: [id number] Arrival date & time: 06/07/22  1614     History  Chief Complaint  Patient presents with   IVC   Manic Behavior    Rickey Miller is a 31 y.o. male.  HPI     31 year old male with a history of schizoaffective disorder bipolar type, polysubstance abuse, cocaine abuse, cannabis abuse, social anxiety disorder, generalized anxiety disorder, nicotine dependence and alcohol use disorder who presents under IVC by [employer] after he was found running through traffic.  He was running through traffic, and became combative when they tried to get him to stop.  He did not state any words to anyone about what he was doing.  Does not state to me why he was in traffic.  He nurse tech spoke with him and his fiance about the IVC protocol.  He stated he has been off of his medication since late May early June because they affected his sex drive.  He later participates in some of the history with me, but does not provide more information about what happened.  He acknowledges that he has been off of his medication.  He does report use of alcohol, cigarettes, and other drugs.  Used cocaine this morning, but did not just prior to this event.  He denies suicidal ideation, homicidal ideation, auditory visual hallucinations.  He has an abrasion over his right forearm.  Denies any other acute injuries.  Reports that he fell down she denies being hit by a car.  Home Medications Prior to Admission medications   Medication Sig Start Date End Date Taking? Authorizing Provider  benztropine (COGENTIN) 1 MG tablet Take 1 tablet (1 mg total) by mouth 2 (two) times daily. 04/13/22 05/13/22  Karsten Ro, MD  dicyclomine (BENTYL) 20 MG tablet Take 1 tablet (20 mg total) by mouth 2 (two) times daily. 04/19/22   Cristopher Peru, PA-C  haloperidol (HALDOL) 5 MG tablet Take 1 tablet (5 mg total) by mouth 2 (two) times daily. 04/13/22 05/13/22  Karsten Ro, MD  hydrOXYzine (ATARAX) 25 MG tablet Take 1 tablet (25 mg total) by mouth 3 (three) times daily as needed for anxiety. 03/26/22   Karsten Ro, MD  nicotine polacrilex (NICORETTE) 2 MG gum Take 1 each (2 mg total) by mouth as needed for smoking cessation. 04/13/22   Karsten Ro, MD  ondansetron (ZOFRAN) 4 MG tablet Take 1 tablet (4 mg total) by mouth every 6 (six) hours. 04/19/22   Cristopher Peru, PA-C  thiamine 100 MG tablet Take 1 tablet (100 mg total) by mouth daily. 04/13/22   Karsten Ro, MD  traZODone (DESYREL) 100 MG tablet Take 1 tablet (100 mg total) by mouth at bedtime. 04/13/22 05/13/22  Karsten Ro, MD      Allergies    Patient has no known allergies.    Review of Systems   Review of Systems  Physical Exam Updated Vital Signs BP 136/86   Pulse (!) 106   Temp 98.1 F (36.7 C) (Oral)   Resp 16   Ht 5\' 7"  (1.702 m)   Wt 81.6 kg   SpO2 99%   BMI 28.19 kg/m  Physical Exam Vitals and nursing note reviewed.  Constitutional:      General: He is not in acute distress.    Appearance: He is well-developed. He is not diaphoretic.  HENT:     Head: Normocephalic and atraumatic.  Eyes:     Conjunctiva/sclera: Conjunctivae  normal.  Cardiovascular:     Rate and Rhythm: Normal rate and regular rhythm.     Heart sounds: Normal heart sounds. No murmur heard.    No friction rub. No gallop.  Pulmonary:     Effort: Pulmonary effort is normal. No respiratory distress.     Breath sounds: Normal breath sounds. No wheezing or rales.  Abdominal:     General: There is no distension.     Palpations: Abdomen is soft.     Tenderness: There is no abdominal tenderness. There is no guarding.  Musculoskeletal:     Cervical back: Normal range of motion.  Skin:    General: Skin is warm and dry.     Comments: Abrasion right forearm  Neurological:     Mental Status: He is alert and oriented to person, place, and time.     ED Results / Procedures / Treatments   Labs (all labs  ordered are listed, but only abnormal results are displayed) Labs Reviewed  COMPREHENSIVE METABOLIC PANEL - Abnormal; Notable for the following components:      Result Value   Chloride 112 (*)    Creatinine, Ser 1.30 (*)    Total Protein 5.5 (*)    Alkaline Phosphatase 23 (*)    All other components within normal limits  ACETAMINOPHEN LEVEL - Abnormal; Notable for the following components:   Acetaminophen (Tylenol), Serum <10 (*)    All other components within normal limits  SALICYLATE LEVEL - Abnormal; Notable for the following components:   Salicylate Lvl <7.0 (*)    All other components within normal limits  RESP PANEL BY RT-PCR (FLU A&B, COVID) [id number]  ETHANOL  CBC WITH DIFFERENTIAL/PLATELET  RAPID URINE DRUG SCREEN, HOSP PERFORMED    EKG EKG Interpretation  Date/Time:  Thursday June 07 2022 19:25:14 EDT Ventricular Rate:  60 PR Interval:  195 QRS Duration: 95 QT Interval:  409 QTC Calculation: 409 R Axis:   87 Text Interpretation: Incomplete analysis due to missing data in precordial lead(s) Sinus rhythm Missing lead(s): V3 No QT prolongation. Confirmed by Alvira Monday ([id number]) on 06/07/2022 9:21:42 PM  Radiology No results found.  Procedures Procedures    Medications Ordered in ED Medications  neomycin-bacitracin-polymyxin [phone] OINT 1 Application (has no administration in time range)  risperiDONE (RISPERDAL M-TABS) disintegrating tablet 2 mg (has no administration in time range)    And  LORazepam (ATIVAN) tablet 1 mg (has no administration in time range)    And  ziprasidone (GEODON) injection 20 mg (has no administration in time range)  acetaminophen (TYLENOL) tablet 650 mg (has no administration in time range)    ED Course/ Medical Decision Making/ A&P                           Medical Decision Making Amount and/or Complexity of Data Reviewed Labs: ordered.  Risk OTC drugs. Prescription drug management.    31 year old male with a history  of schizoaffective disorder bipolar type, polysubstance abuse, cocaine abuse, cannabis abuse, social anxiety disorder, generalized anxiety disorder, nicotine dependence and alcohol use disorder who presents under IVC by [employer] after he was found running through traffic.   Has an abrasion over his arm, but otherwise no sign of acute medical concerns or illness.  Labs were completed and interpreted by me and showed no significant findings.  He is medically cleared  First exam filled out given concern for history of schizoaffective disorder off of medication, with dangerous  behavior of running through traffic.  He does appear coherent on my initial evaluation, but is quiet and does not answer questions regarding what happened.  Will uphold the IVC that is in place, given concern that he is a threat to himself, and TTS consulted.         Final Clinical Impression(s) / ED Diagnoses Final diagnoses:  Potential for intentional self-harm    Rx / DC Orders ED Discharge Orders     None         Alvira Monday, MD 06/08/22 [phone]

## 2022-06-07 NOTE — ED Notes (Signed)
This NT spoke with pt and pt"s fiance about the current situation and IVC protocol.  Pt states that he has been off his medication since late May early June. Pt stated it was because his medications killed his sex drive. NT advised that pt "chill out and watch some tv" while he waits for TTS. Pt is calm and collected.

## 2022-06-08 DIAGNOSIS — T1491XA Suicide attempt, initial encounter: Secondary | ICD-10-CM

## 2022-06-08 MED ORDER — OLANZAPINE 5 MG PO TBDP
5.0000 mg | ORAL_TABLET | Freq: Every day | ORAL | Status: DC
  Administered 2022-06-08 – 2022-06-10 (×3): 5 mg via ORAL
  Filled 2022-06-08 (×3): qty 1

## 2022-06-08 NOTE — ED Notes (Addendum)
Pt has an abrasion on R posterior forearm, which was present on admission.  Pt is unsure how he got it.  No bleeding noted and area looks to be healing.  Non-adherent pad and kling wrap placed for comfort.
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
## 2022-06-08 NOTE — ED Provider Notes (Signed)
Emergency Medicine Observation Re-evaluation Note  Rickey Miller is a 31 y.o. male, seen on rounds today.  Pt initially presented to the ED for complaints of IVC and Manic Behavior Currently, the patient is resting comfortably.  Physical Exam  BP (!) 108/51 (BP Location: Right Arm)   Pulse 60   Temp 97.6 F (36.4 C) (Oral)   Resp 16   Ht 5\' 7"  (1.702 m)   Wt 81.6 kg   SpO2 98%   BMI 28.19 kg/m  Physical Exam General: No agitation Cardiac: No murmur on my auscultation Lungs: Clear Psych: No acute agitation  ED Course / MDM  EKG:EKG Interpretation  Date/Time:  Thursday June 07 2022 19:25:14 EDT Ventricular Rate:  60 PR Interval:  195 QRS Duration: 95 QT Interval:  409 QTC Calculation: 409 R Axis:   87 Text Interpretation: Incomplete analysis due to missing data in precordial lead(s) Sinus rhythm Missing lead(s): V3 No QT prolongation. Confirmed by 05-31-1994 (Alvira Monday) on 06/07/2022 9:21:42 PM  I have reviewed the labs performed to date as well as medications administered while in observation.  Recent changes in the last 24 hours include none reported by nursing.  Plan  Current plan is for awaiting psychiatric recommendations. Rickey Miller is under involuntary commitment.      Rickey Miller, Rickey Deutscher, MD 06/08/22 [phone]

## 2022-06-08 NOTE — Consult Note (Signed)
Telepsych Consultation   Reason for Consult:  Psychiatric Reassessment Referring Physician:  Dr. Gareth Morgan Location of Patient:    [facility] Location of Provider: Other: virtual home office  Patient Identification: Hoa Deriso MRN:  [id number] Principal Diagnosis: Suicide attempt [facility]) Diagnosis:  Principal Problem:   Suicide attempt [facility]) Active Problems:   Schizoaffective disorder ([place])   GAD (generalized anxiety disorder)   Total Time spent with patient: 30 minutes  Subjective:   Dominie Holeman is a 31 y.o. male patient admitted via IVC for walking into traffic as a suicide attempt.    HPI:   Patient seen via telepsych by this provider; chart reviewed and consulted with Dr. Dwyane Dee on 06/08/22.  On evaluation Wilbern Krammes states, "I was having a bad day yesterday." When asked about events that occurred prior to admission.  He acknowledges he was using "crack" and was trying to end his life by walking into traffic.  Pt appears irritated while talking with the writer and states he no longer feels this way and want to go home. Pt initially guarded, writer offered anticipatory guidance regarding assessment process, pt now calmer,still anxious but cooperative.  He reiterates he no longer wants to end and wants to go home. He endorses a hx for schizoaffective disorder, but currently denies audible or visual hallucinations.  States  he ran out of the medications and did not get them refilled. Historically he used to get LAI and take oral haldol, last injection was April/May timeframe. Additionally tells me he stopped taking haldol po because of erectile dysfunction side effects, "I'm not trying to be weird or anything but that medication messed with my manhood."  States he's open to taking meds but not haldol. Discussed starting another med for schizoaffective disorder and pt agrees to this.   Pt reports he works 2 jobs and he needs to get there today. He reports he lives with  his girlfriend, Nelly Rout _[phone] and gives permission to reach out to her for collateral. Spoke with Ellison Hughs, who states she is not surprised that patient was trying to walk into traffic because he has a hx of trying to end his life when he gets upset.  She repots they had an argument earlier that day and believes patient became suicidal and intentionally walked into traffic. She states she believes pt was using illicit drugs but states he's also depressed and limited coping skills.     Pt is clear and coherent during assessment, and able to appropriately engage in conversation and make his needs known.  He does not appear psychotic and is not responding to internal stimulus but he's guarded, and appears sad and hopeless.  He has an 85 year old daughter with whom he does not have contact with; also denies familial support for reasons he does not share.  Of note, patient was recently discharged from [facility] 04/2022 where he was inpatient for psychosis. Pt was stabilized and discharged to follow-up with outpatient psychiatry but has not done so.  Today depression appears more prevalent today and he has limited to no protective factors; active polysubstance use makes him more impulsive and inclined for self harm. Recommend upholding IVC and refer for psychiatric admission.      Per ED Provider Admission Assessment 06/07/2022: Chief Complaint  Patient presents with   IVC   Manic Behavior      Jediah Huish is a 31 y.o. male.   HPI     31 year old male with a history of schizoaffective  disorder bipolar type, polysubstance abuse, cocaine abuse, cannabis abuse, social anxiety disorder, generalized anxiety disorder, nicotine dependence and alcohol use disorder who presents under IVC by [employer] after he was found running through traffic.  He was running through traffic, and became combative when they tried to get him to stop.  He did not state any words to anyone about what he was doing.  Does not  state to me why he was in traffic.  He nurse tech spoke with him and his fiance about the IVC protocol.  He stated he has been off of his medication since late May early June because they affected his sex drive.   Past Psychiatric History: as outlined above  Risk to Self:  yes Risk to Others:  no Prior Inpatient Therapy:  yes Prior Outpatient Therapy:  was referred but has not followed up  Past Medical History:  Past Medical History:  Diagnosis Date   Bipolar 1 disorder ([place])    Schizophrenia ([place])     Past Surgical History:  Procedure Laterality Date   TONSILLECTOMY     Family History: History reviewed. No pertinent family history. Family Psychiatric  History: unknown Social History:  Social History   Substance and Sexual Activity  Alcohol Use Yes   Comment: Friday, Sat     Social History   Substance and Sexual Activity  Drug Use Yes   Types: Marijuana, Methamphetamines, Heroin   Comment: weekly; unable to quantify    Social History   Socioeconomic History   Marital status: Unknown    Spouse name: Not on file   Number of children: Not on file   Years of education: Not on file   Highest education level: Not on file  Occupational History   Not on file  Tobacco Use   Smoking status: Every Day    Packs/day: 1.00    Years: 13.00    Total pack years: 13.00    Types: Cigarettes   Smokeless tobacco: Never  Vaping Use   Vaping Use: Former  Substance and Sexual Activity   Alcohol use: Yes    Comment: Friday, Sat   Drug use: Yes    Types: Marijuana, Methamphetamines, Heroin    Comment: weekly; unable to quantify   Sexual activity: Yes    Birth control/protection: None  Other Topics Concern   Not on file  Social History Narrative   Not on file   Social Determinants of Health   Financial Resource Strain: Not on file  Food Insecurity: Not on file  Transportation Needs: Not on file  Physical Activity: Not on file  Stress: Not on file  Social Connections:  Not on file   Additional Social History:    Allergies:  No Known Allergies  Labs:  Results for orders placed or performed during the hospital encounter of 06/07/22 (from the past 48 hour(s))  Resp Panel by RT-PCR (Flu A&B, Covid) Anterior Nasal Swab     Status: None   Collection Time: 06/07/22  2:31 PM   Specimen: Anterior Nasal Swab  Result Value Ref Range   SARS Coronavirus 2 by RT PCR NEGATIVE NEGATIVE    Comment: (NOTE) SARS-CoV-2 target nucleic acids are NOT DETECTED.  The SARS-CoV-2 RNA is generally detectable in upper respiratory specimens during the acute phase of infection. The lowest concentration of SARS-CoV-2 viral copies this assay can detect is 138 copies/mL. A negative result does not preclude SARS-Cov-2 infection and should not be used as the sole basis for treatment or other patient  management decisions. A negative result may occur with  improper specimen collection/handling, submission of specimen other than nasopharyngeal swab, presence of viral mutation(s) within the areas targeted by this assay, and inadequate number of viral copies(<138 copies/mL). A negative result must be combined with clinical observations, patient history, and epidemiological information. The expected result is Negative.  Fact Sheet for Patients:  EntrepreneurPulse.com.au  Fact Sheet for Healthcare Providers:  IncredibleEmployment.be  This test is no t yet approved or cleared by the [place] FDA and  has been authorized for detection and/or diagnosis of SARS-CoV-2 by FDA under an Emergency Use Authorization (EUA). This EUA will remain  in effect (meaning this test can be used) for the duration of the COVID-19 declaration under Section 564(b)(1) of the Act, 21 U.S.C.section 360bbb-3(b)(1), unless the authorization is terminated  or revoked sooner.       Influenza A by PCR NEGATIVE NEGATIVE   Influenza B by PCR NEGATIVE NEGATIVE     Comment: (NOTE) The Xpert Xpress SARS-CoV-2/FLU/RSV plus assay is intended as an aid in the diagnosis of influenza from Nasopharyngeal swab specimens and should not be used as a sole basis for treatment. Nasal washings and aspirates are unacceptable for Xpert Xpress SARS-CoV-2/FLU/RSV testing.  Fact Sheet for Patients: EntrepreneurPulse.com.au  Fact Sheet for Healthcare Providers: IncredibleEmployment.be  This test is not yet approved or cleared by the [place] FDA and has been authorized for detection and/or diagnosis of SARS-CoV-2 by FDA under an Emergency Use Authorization (EUA). This EUA will remain in effect (meaning this test can be used) for the duration of the COVID-19 declaration under Section 564(b)(1) of the Act, 21 U.S.C. section 360bbb-3(b)(1), unless the authorization is terminated or revoked.  Performed at [facility], [place] [address], [place], [place] [address]   Comprehensive metabolic panel     Status: Abnormal   Collection Time: 06/07/22  5:19 PM  Result Value Ref Range   Sodium 142 135 - 145 mmol/L   Potassium 4.0 3.5 - 5.1 mmol/L   Chloride 112 (H) 98 - 111 mmol/L   CO2 22 22 - 32 mmol/L   Glucose, Bld 92 70 - 99 mg/dL    Comment: Glucose reference range applies only to samples taken after fasting for at least 8 hours.   BUN 8 6 - 20 mg/dL   Creatinine, Ser 1.30 (H) 0.61 - 1.24 mg/dL   Calcium 9.3 8.9 - 10.3 mg/dL   Total Protein 5.5 (L) 6.5 - 8.1 g/dL   Albumin 3.6 3.5 - 5.0 g/dL   AST 23 15 - 41 U/L   ALT 17 0 - 44 U/L   Alkaline Phosphatase 23 (L) 38 - 126 U/L   Total Bilirubin 1.1 0.3 - 1.2 mg/dL   GFR, Estimated >60 >60 mL/min    Comment: (NOTE) Calculated using the CKD-EPI Creatinine Equation (2021)    Anion gap 8 5 - 15    Comment: Performed at [facility], [place] [address], [place], [place] [address]  Ethanol     Status: None   Collection Time: 06/07/22  5:19 PM  Result Value Ref Range    Alcohol, Ethyl (B) <10 <10 mg/dL    Comment: (NOTE) Lowest detectable limit for serum alcohol is 10 mg/dL.  For medical purposes only. Performed at [facility], [place] [address] [place], [place] [address]   CBC with Diff     Status: None   Collection Time: 06/07/22  5:19 PM  Result Value Ref  Range   WBC 7.6 4.0 - 10.5 K/uL   RBC 5.00 4.22 - 5.81 MIL/uL   Hemoglobin 14.7 13.0 - 17.0 g/dL   HCT 42.7 39.0 - 52.0 %   MCV 85.4 80.0 - 100.0 fL   MCH 29.4 26.0 - 34.0 pg   MCHC 34.4 30.0 - 36.0 g/dL   RDW 12.8 11.5 - 15.5 %   Platelets 255 150 - 400 K/uL   nRBC 0.0 0.0 - 0.2 %   Neutrophils Relative % 76 %   Neutro Abs 5.8 1.7 - 7.7 K/uL   Lymphocytes Relative 16 %   Lymphs Abs 1.2 0.7 - 4.0 K/uL   Monocytes Relative 7 %   Monocytes Absolute 0.5 0.1 - 1.0 K/uL   Eosinophils Relative 0 %   Eosinophils Absolute 0.0 0.0 - 0.5 K/uL   Basophils Relative 0 %   Basophils Absolute 0.0 0.0 - 0.1 K/uL   Immature Granulocytes 1 %   Abs Immature Granulocytes 0.04 0.00 - 0.07 K/uL    Comment: Performed at [facility] [address] [place], [place] [address]  Acetaminophen level     Status: Abnormal   Collection Time: 06/07/22  5:19 PM  Result Value Ref Range   Acetaminophen (Tylenol), Serum <10 (L) 10 - 30 ug/mL    Comment: (NOTE) Therapeutic concentrations vary significantly. A range of 10-30 ug/mL  may be an effective concentration for many patients. However, some  are best treated at concentrations outside of this range. Acetaminophen concentrations >150 ug/mL at 4 hours after ingestion  and >50 ug/mL at 12 hours after ingestion are often associated with  toxic reactions.  Performed at [facility], [place] [address] [place], [place] [address]   Salicylate level     Status: Abnormal   Collection Time: 06/07/22  5:19 PM  Result Value Ref Range   Salicylate Lvl <5.0 (L) 7.0 - 30.0 mg/dL    Comment: Performed at [facility] [address] [place],  [place] [address]    Medications:  Current Facility-Administered Medications  Medication Dose Route Frequency Provider Last Rate Last Admin   acetaminophen (TYLENOL) tablet 650 mg  650 mg Oral Q4H PRN Gareth Morgan, MD       risperiDONE (RISPERDAL M-TABS) disintegrating tablet 2 mg  2 mg Oral Q8H PRN Gareth Morgan, MD       And   LORazepam (ATIVAN) tablet 1 mg  1 mg Oral PRN Gareth Morgan, MD       And   ziprasidone (GEODON) injection 20 mg  20 mg Intramuscular PRN Gareth Morgan, MD       neomycin-bacitracin-polymyxin [phone] OINT 1 Application  1 Application Apply externally Daily Gareth Morgan, MD   1 Application at 24/09/73 1006   OLANZapine zydis (ZYPREXA) disintegrating tablet 5 mg  5 mg Oral Daily Merlyn Lot E, NP   5 mg at 06/08/22 1351   Current Outpatient Medications  Medication Sig Dispense Refill   benztropine (COGENTIN) 1 MG tablet Take 1 tablet (1 mg total) by mouth 2 (two) times daily. (Patient not taking: Reported on 06/08/2022) 60 tablet 0   dicyclomine (BENTYL) 20 MG tablet Take 1 tablet (20 mg total) by mouth 2 (two) times daily. (Patient not taking: Reported on 06/08/2022) 20 tablet 0   haloperidol (HALDOL) 5 MG tablet Take 1 tablet (5 mg total) by mouth 2 (two) times daily. (Patient not taking: Reported on 06/08/2022) 60 tablet 0   hydrOXYzine (ATARAX) 25 MG  tablet Take 1 tablet (25 mg total) by mouth 3 (three) times daily as needed for anxiety. (Patient not taking: Reported on 06/08/2022) 30 tablet 0   nicotine polacrilex (NICORETTE) 2 MG gum Take 1 each (2 mg total) by mouth as needed for smoking cessation. (Patient not taking: Reported on 06/08/2022) 100 tablet 0   ondansetron (ZOFRAN) 4 MG tablet Take 1 tablet (4 mg total) by mouth every 6 (six) hours. (Patient not taking: Reported on 06/08/2022) 12 tablet 0   thiamine 100 MG tablet Take 1 tablet (100 mg total) by mouth daily. (Patient not taking: Reported on 06/08/2022) 30 tablet 0   traZODone (DESYREL) 100 MG tablet  Take 1 tablet (100 mg total) by mouth at bedtime. (Patient not taking: Reported on 06/08/2022) 30 tablet 0    Musculoskeletal: pt moves all extremities and ambulates independently Strength & Muscle Tone: within normal limits Gait & Station: normal Patient leans: N/A   Psychiatric Specialty Exam:  Presentation  General Appearance: Casual  Eye Contact:Fair  Speech:Clear and Coherent  Speech Volume:Decreased  Handedness:Right   Mood and Affect  Mood:Anxious; Irritable; Depressed  Affect:Congruent   Thought Process  Thought Processes:Coherent; Goal Directed  Descriptions of Associations:Intact  Orientation:Full (Time, Place and Person)  Thought Content:Illogical (states he wanted to end his life prior to arrival)  History of Schizophrenia/Schizoaffective disorder:Yes  Duration of Psychotic Symptoms:Greater than six months  Hallucinations:Hallucinations: None  Ideas of Reference:None  Suicidal Thoughts:Suicidal Thoughts: Yes, Active SI Active Intent and/or Plan: With Intent; With Plan; With Means to Carry Out  Homicidal Thoughts:Homicidal Thoughts: No   Sensorium  Memory:Immediate Good; Recent Good; Remote Good  Judgment:Poor (at baseline judgment is fair --as pt does not take meds for psychiatric concerns but self medicates with illicit substances)  Insight:Fair   Executive Functions  Concentration:Fair  Attention Span:Fair  [place] of Knowledge:Other (comment)  Language:Good   Psychomotor Activity  Psychomotor Activity:Psychomotor Activity: Normal   Assets  Assets:Communication Skills; Housing   Sleep  Sleep:Sleep: Fair Number of Hours of Sleep: 5    Physical Exam: Physical Exam Musculoskeletal:     Cervical back: Normal range of motion.    Review of Systems  Constitutional: Negative.   HENT: Negative.    Eyes: Negative.   Respiratory: Negative.    Cardiovascular: Negative.   Gastrointestinal: Negative.   Skin:  Negative.   Neurological:  Negative for dizziness, tremors, seizures, weakness and headaches.  Endo/Heme/Allergies: Negative.   Psychiatric/Behavioral:  Positive for depression, substance abuse and suicidal ideas. Negative for hallucinations and memory loss. The patient is nervous/anxious.    Blood pressure (!) 108/51, pulse 60, temperature 97.6 F (36.4 C), temperature source Oral, resp. rate 16, height 5' 7" (1.702 m), weight 81.6 kg, SpO2 98 %. Body mass index is 28.19 kg/m.  Treatment Plan Summary: Patient with hx of polysubstance abuse and schizoaffective disorder-currently depressed, here under IVC for suicide attempt by walking into traffic.  He will need admission to restart medications and where he can be monitored for mood stability.    Daily contact with patient to assess and evaluate symptoms and progress in treatment and Medication management EKG completed 06/07/2022 -No prolonged QT  Alk phos decreased at 23 but this appears consistent with previous labs as far back as 03/2022.  Actually shows slight improvement. CBC is wnl UDS is pending  Patient does want haldol d/t intolerable side effect of erectile dysfunction. Will recommend the following:  Medications: Olanzapine 21m po daily for mood/schizoaffective disorder Continue  agitation protocol   Disposition: Recommend psychiatric Inpatient admission when medically cleared.  This service was provided via telemedicine using a 2-way, interactive audio and video technology.  Names of all persons participating in this telemedicine service and their role in this encounter. Name: Nyshawn Gowdy Role: Patient  Name: [facility] Role: Girlfriend  Name: Merlyn Lot Role: PMHNP    Mallie Darting, NP 06/08/2022 2:15 PM

## 2022-06-08 NOTE — ED Notes (Signed)
Pt requested this writer call his places of work to inform them that he is here.    [employer] on [facility] [phone] [place] on [facility] [phone]  This writer attempted to call [employer] w/o answer and [employer] does not open until 10a.  Pt made aware.

## 2022-06-08 NOTE — Progress Notes (Signed)
Patient has been denied by [facility] and has been instructed to be faxed out. Patient meets [facility] inpatient criteria per Rockney Ghee, NP. Patient has been faxed out to the following facilities:    [facility]  [address] [place] [address] [phone]  [facility]  [address], [place] [address] [phone]  [facility]  [address] [place] [address] [phone]  [facility]  [address], [place] [address] [phone]  [facility]  [address] [place] [address] [phone]  [facility]  [address] [place], [place] [address] [phone]  [facility]  [address] [place], [place] [address] [phone]  [facility]  [address] [place] [address] [phone]  [facility]  [address] [place] [address] [phone]  [facility]  [address] [place] [address] [phone]  [facility]  [address] [place] [address] [phone]   Damita Dunnings, MSW, LCSW-A  7:48 PM 06/08/2022

## 2022-06-08 NOTE — ED Notes (Signed)
Pt receiving TTS assessment.

## 2022-06-08 NOTE — ED Notes (Signed)
TTS in process 

## 2022-06-08 NOTE — ED Notes (Signed)
This Clinical research associate has attempted to call Pt's girlfriend, Alvy Beal [phone]), [phone] to notify her of [facility] trying to obtain collateral w/o success.

## 2022-06-08 NOTE — ED Notes (Signed)
First Exam was done and handed to purple nurse.

## 2022-06-08 NOTE — Progress Notes (Signed)
CSW requested [facility] AC to review. CSW will assist and follow with placement.  Maryjean Ka, MSW, [facility] 06/08/2022 3:38 PM

## 2022-06-09 LAB — URINALYSIS, ROUTINE W REFLEX MICROSCOPIC
Bilirubin Urine: NEGATIVE
Glucose, UA: NEGATIVE mg/dL
Hgb urine dipstick: NEGATIVE
Ketones, ur: NEGATIVE mg/dL
Leukocytes,Ua: NEGATIVE
Nitrite: NEGATIVE
Protein, ur: NEGATIVE mg/dL
Specific Gravity, Urine: 1.02 (ref 1.005–1.030)
pH: 5 (ref 5.0–8.0)

## 2022-06-09 LAB — RAPID URINE DRUG SCREEN, HOSP PERFORMED
Amphetamines: NOT DETECTED
Barbiturates: NOT DETECTED
Benzodiazepines: NOT DETECTED
Cocaine: POSITIVE — AB
Opiates: NOT DETECTED
Tetrahydrocannabinol: POSITIVE — AB

## 2022-06-09 MED ORDER — DIPHENHYDRAMINE HCL 50 MG/ML IJ SOLN
50.0000 mg | Freq: Once | INTRAMUSCULAR | Status: DC
  Filled 2022-06-09: qty 1

## 2022-06-09 MED ORDER — LORAZEPAM 2 MG/ML IJ SOLN
2.0000 mg | Freq: Once | INTRAMUSCULAR | Status: DC
  Filled 2022-06-09: qty 1

## 2022-06-09 NOTE — ED Notes (Signed)
Explained to pt that someone will come talk to him about his disposition. This RN asked CN to come talk to the pt. Pt states, "If they are going to tell me they are keeping me, we are going to have some fun today." CN at bedside explaining to pt that we need urine and explaining why. Pt escalating and becoming verbally aggressive.

## 2022-06-09 NOTE — ED Notes (Signed)
Pt updated that transport will take him to [facility] tomorrow. Pt calm cooperative and w/ depressed affect.

## 2022-06-09 NOTE — ED Notes (Signed)
Per Georganna Skeans at [facility], pt can come to facility after 7pm and report to be given after 7pm. Updated [facility] team and will notify night shift team.

## 2022-06-09 NOTE — ED Notes (Signed)
Gray DO at bedside. Pt is continuing to escalate and walk towards the door. Security at door trying to deescalate pt.

## 2022-06-09 NOTE — ED Notes (Signed)
Urine specimen obtained at this time. Pt ambulated back to room.

## 2022-06-09 NOTE — Progress Notes (Signed)
Per [facility], pt has been accepted to [facility], adult unit. Accepting provider is Dr. Sherryle Lis. Patient can arrive anytime. Number for report is [phone].   Crissie Reese, MSW, LCSW-A Phone: [phone] Disposition/TOC

## 2022-06-09 NOTE — ED Provider Notes (Signed)
Emergency Medicine Observation Re-evaluation Note  Rickey Miller is a 31 y.o. male, seen on rounds today.  Pt initially presented to the ED for complaints of IVC and Manic Behavior Currently, the patient is resting comfortably.  Physical Exam  BP 116/73 (BP Location: Left Arm)   Pulse 68   Temp 97.9 F (36.6 C) (Oral)   Resp 17   Ht 5\' 7"  (1.702 m)   Wt 81.6 kg   SpO2 96%   BMI 28.19 kg/m  Physical Exam General: No distress Cardiac: Well perfused Lungs: Even and unlabored Psych: No acute agitation  ED Course / MDM  EKG:EKG Interpretation  Date/Time:  Thursday June 07 2022 19:25:14 EDT Ventricular Rate:  60 PR Interval:  195 QRS Duration: 95 QT Interval:  409 QTC Calculation: 409 R Axis:   87 Text Interpretation: Incomplete analysis due to missing data in precordial lead(s) Sinus rhythm Missing lead(s): V3 No QT prolongation. Confirmed by 05-31-1994 (Alvira Monday) on 06/07/2022 9:21:42 PM  I have reviewed the labs performed to date as well as medications administered while in observation.  Recent changes in the last 24 hours include none reported by nursing.  Plan  Current plan is for inpatient admission, social work coordinating. Jawann Youman is under involuntary commitment.       Daphine Deutscher, MD 06/09/22 1404    08/10/22, MD 06/10/22 [phone]

## 2022-06-09 NOTE — ED Notes (Signed)
Sitter no longer at bedside d/t staffing; staffing and CN are aware

## 2022-06-09 NOTE — ED Notes (Addendum)
Pt anxious and keeps asking about when he is leaving and that he has to go to work. This RN was told in report that pt was never told that he was going to be going inpatient. This RN notified Donald Siva, NP of this. Expressed concern about RN telling pt his disposition d/t growing agitation from pt. Per secure chat messages were read and this RN received no response. Notified CN

## 2022-06-09 NOTE — ED Notes (Signed)
Attempted to call report to Rickey Miller and was asked to call back in a few minutes.

## 2022-06-09 NOTE — ED Notes (Signed)
Pt walked to bathroom w/ urine specimen cup

## 2022-06-09 NOTE — Consult Note (Signed)
Reached out to [name], RN and Dr. [name] to touch base regarding Rickey Miller reviewing pt for psych admission pending U/A and UDS.  Communicated with above nurse to assist with assistance in prioritizing labs to expedite potential patient placement.   Additionally, charge nurse Rickey Miller apprised of above and agrees to assist in obtaining labs.

## 2022-06-09 NOTE — ED Notes (Signed)
[name] DO explained to pt his disposition. Pt is calm and cooperative at this time.

## 2022-06-09 NOTE — Progress Notes (Signed)
Csw spoke with Britta Mccreedy with [facility] admissions. Britta Mccreedy asked could the transport to there facility can be delayed until after 7:00pm today due to staffing issues. She ensured that the bed will still be available for 24 hours. CSW notified treatment team.   Crissie Reese, MSW, LCSW-A, LCAS Phone: [phone] Disposition/TOC

## 2022-06-09 NOTE — ED Notes (Signed)
Pt now not able to go to [facility] until tomorrow d/t [employer] can't transport after 7pm.

## 2022-06-09 NOTE — ED Notes (Signed)
Pt updated on inpatient transfer to [facility]. Pt tolerated news well.

## 2022-06-09 NOTE — Progress Notes (Signed)
Britta Mccreedy with [facility] contacted CSW in reference to reviewing this patient for possible placement. Britta Mccreedy requested a UDS and UA for further review. CSW faxed UDS to [facility] for further review.   Crissie Reese, MSW, Lenice Pressman Phone: [phone] Disposition/TOC

## 2022-06-10 NOTE — ED Provider Notes (Signed)
Emergency Medicine Observation Re-evaluation Note  Rickey Miller is a 31 y.o. male, seen on rounds today.  Pt initially presented to the ED for complaints of IVC and Manic Behavior Currently, the patient is resting comfortably.  Physical Exam  BP 107/63 (BP Location: Right Arm)   Pulse 65   Temp 97.8 F (36.6 C) (Oral)   Resp 16   Ht 5\' 7"  (1.702 m)   Wt 81.6 kg   SpO2 97%   BMI 28.19 kg/m  Physical Exam General: No distress Cardiac: Well perfused Lungs: Even and unlabored Psych: No acute agitation  ED Course / MDM  EKG:EKG Interpretation  Date/Time:  Thursday June 07 2022 19:25:14 EDT Ventricular Rate:  60 PR Interval:  195 QRS Duration: 95 QT Interval:  409 QTC Calculation: 409 R Axis:   87 Text Interpretation: Incomplete analysis due to missing data in precordial lead(s) Sinus rhythm Missing lead(s): V3 No QT prolongation. Confirmed by [id number] (Alvira Monday) on 06/07/2022 9:21:42 PM  I have reviewed the labs performed to date as well as medications administered while in observation.  Recent changes in the last 24 hours include none.  Plan  Current plan is for inpatient admission, social work coordinating. [facility] is under involuntary commitment.         Daphine Deutscher, MD 06/10/22 [phone]

## 2022-06-10 NOTE — ED Notes (Signed)
Pt requests his bosses be contacted to inform he is still in the hospital. Attempted to call [employer] with no luck and no ability to leave message. Called [employer] and spoke with manager Monroe who said she would pass along the info to dayshift,

## 2022-06-10 NOTE — ED Notes (Signed)
Report called to Georganna Skeans, RN at [facility] at [phone]. Attempted to call Sheriff[employer] office at [phone] and left message for call back and transport

## 2022-06-10 NOTE — ED Notes (Signed)
Pt upset about transfer and afraid to lose jobs but is calm and cooperative with staff. Pt requested something printed out to read, this nurse printed information about bass fishing for pt.

## 2022-06-10 NOTE — ED Notes (Signed)
This nurse has attempted to call [employer] (pts job) 7 times with no success. Notified patient.  Patient states his L ear is bothering him and this nurse informed him I would pass it along for the provider at look at tomorrow

## 2022-06-11 NOTE — ED Notes (Signed)
Breakfast order placed ?

## 2022-06-11 NOTE — ED Notes (Signed)
Pt in room A&O x4. Updated on plan of care for transfer. Pt receptive to information provided. Consumed 100% of breakfast. Pt ambulated to restroom with a steady gait. Pt made a call.

## 2022-06-11 NOTE — ED Notes (Signed)
Reviewed IVC paperwork; exp. 06/14/22

## 2023-05-25 IMAGING — CT CT HEAD W/O CM
2 series · 15 of 30 positions shown, 17 images · non-contrast
Comparison: None.

CLINICAL DATA: Mental status change, unknown cause



[Series 3: head wo · axial · 0.46mm/px · z∈[-145,-20]mm · 7 of 35 slices shown, 9 images]
[im 5/35  brain]
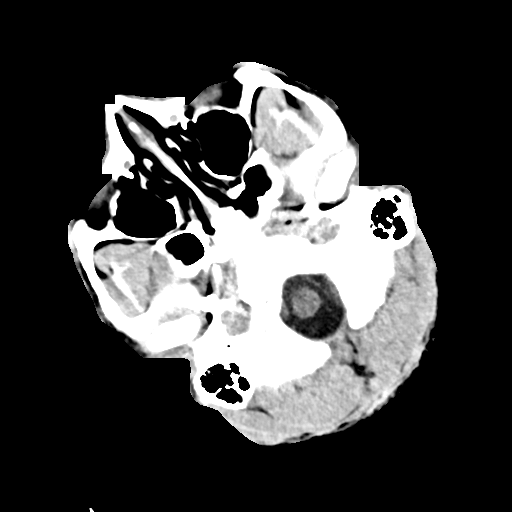
[im 5/35  bone]
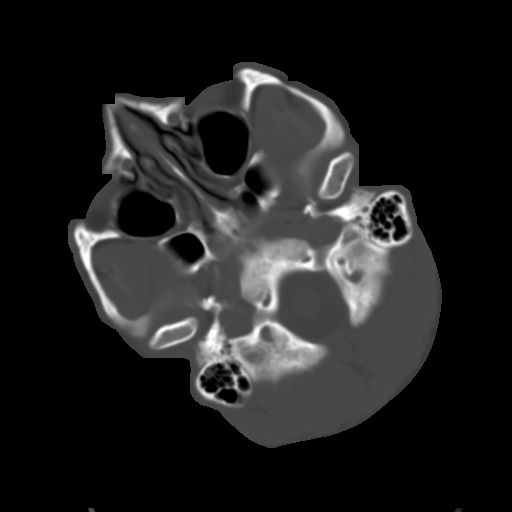
[im 9/35  brain]
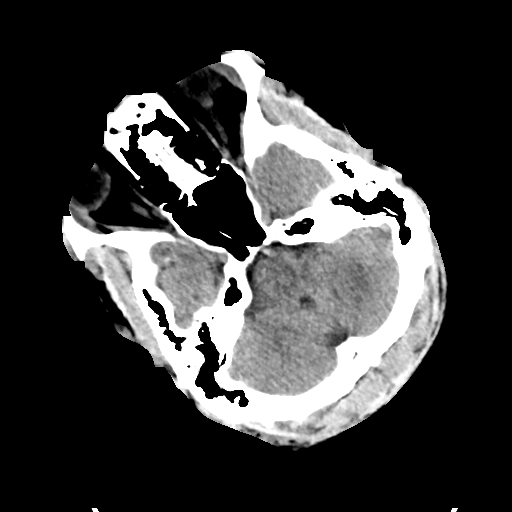
[im 13/35  brain]
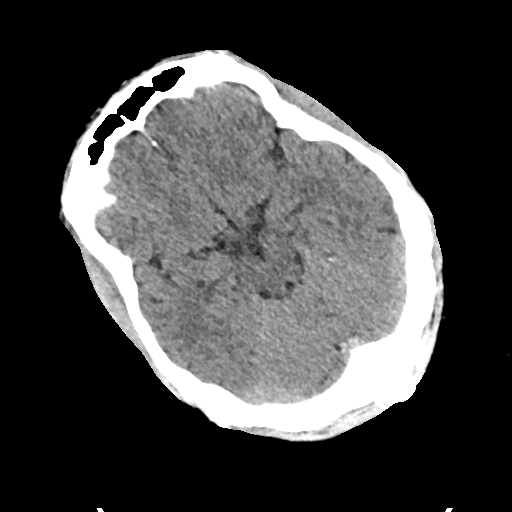
[im 18/35  brain]
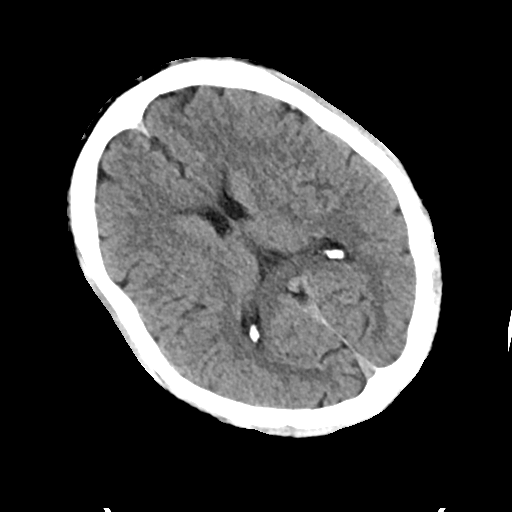
[im 22/35  brain]
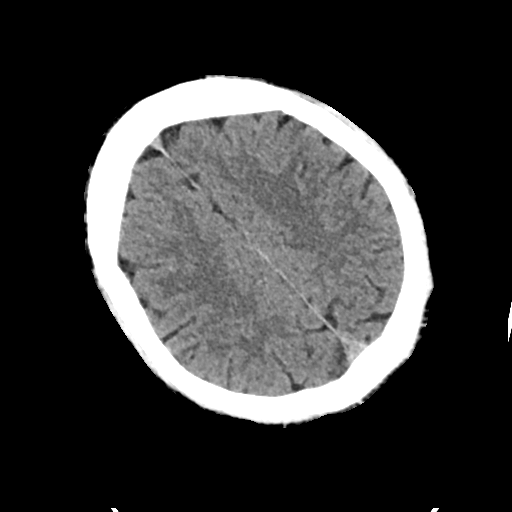
[im 22/35  bone]
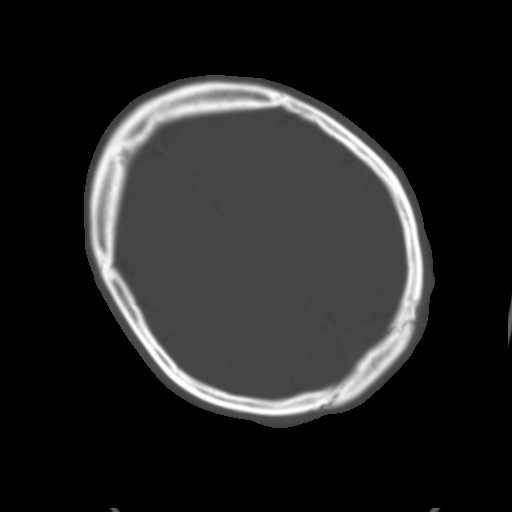
[im 26/35  brain]
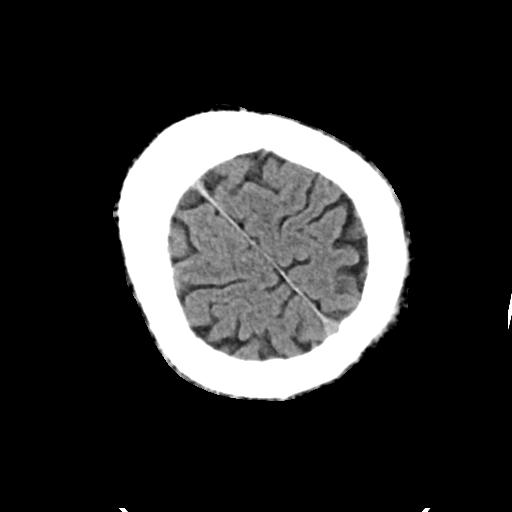
[im 30/35  brain]
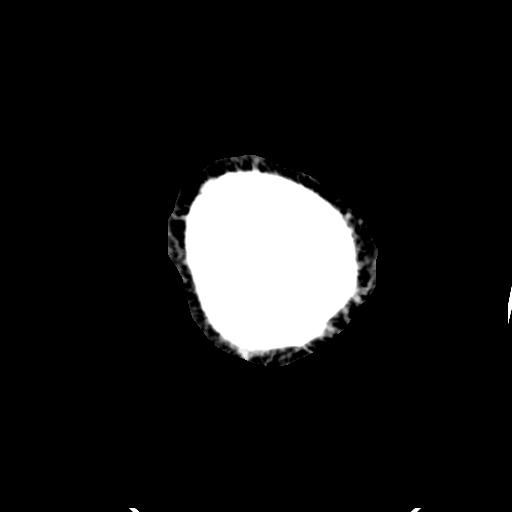

[Series 4: head bone · axial · 0.46mm/px · z∈[-149,-9]mm · 8 of 88 slices shown]
[im 9/88  bone]
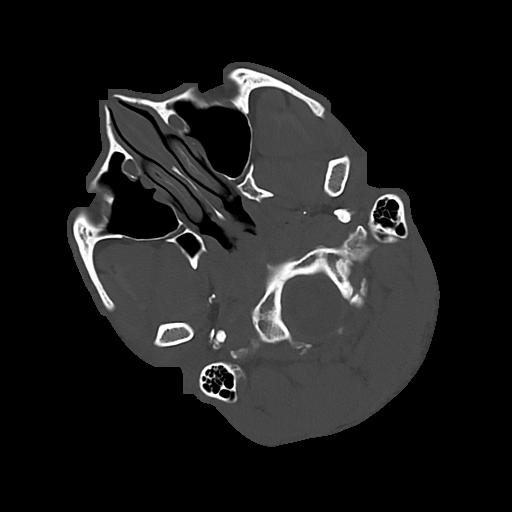
[im 18/88  bone]
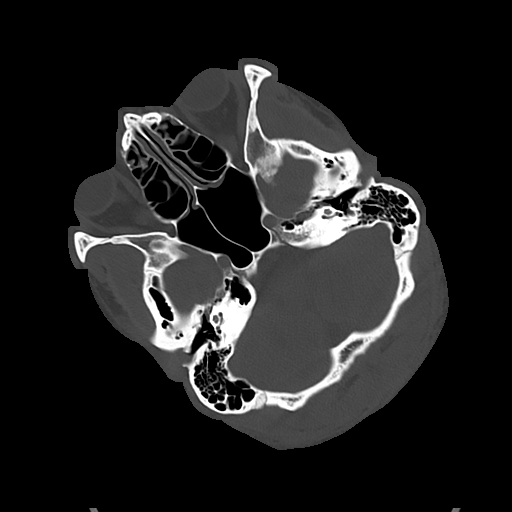
[im 27/88  bone]
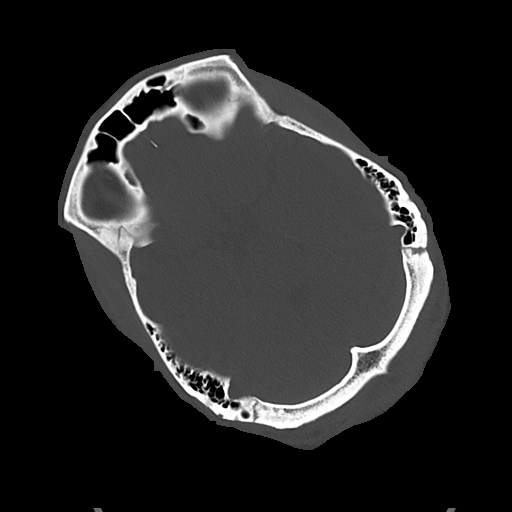
[im 40/88  bone]
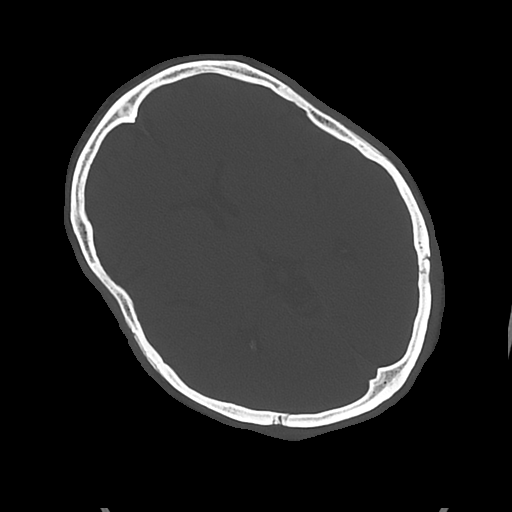
[im 48/88  bone]
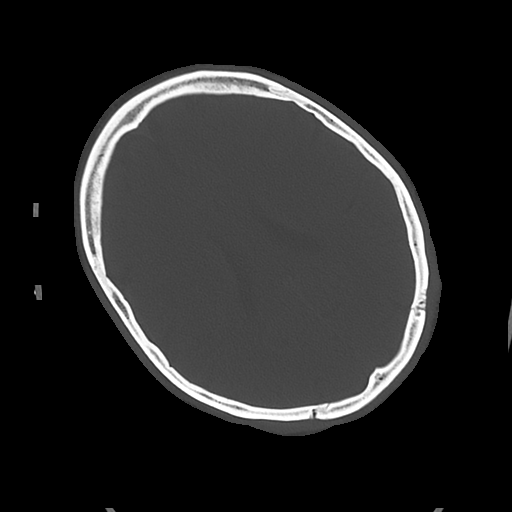
[im 61/88  bone]
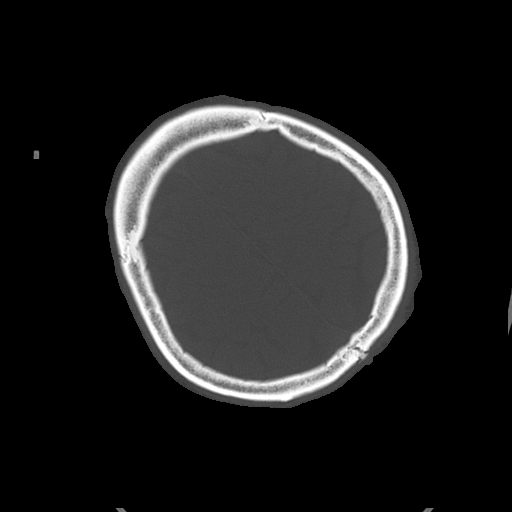
[im 70/88  bone]
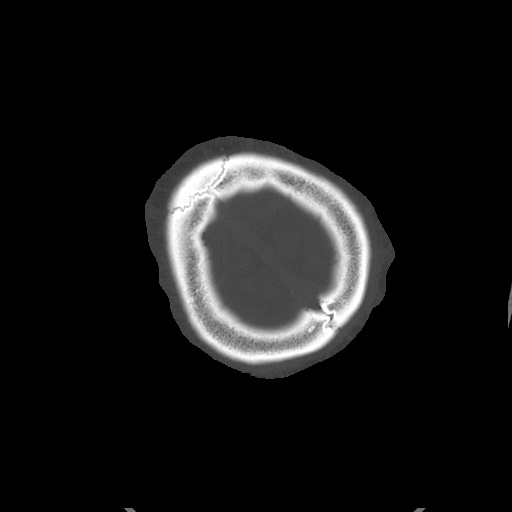
[im 79/88  bone]
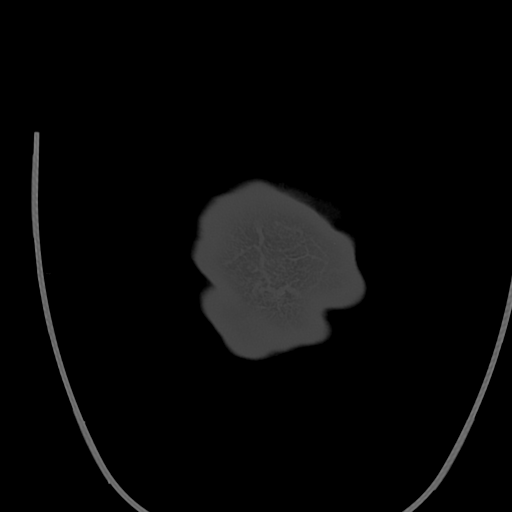

[15 of 30 positions shown; findings below may reference images not displayed]

FINDINGS: Brain:

No evidence of large-territorial acute infarction. No parenchymal
hemorrhage. No mass lesion. No extra-axial collection.

No mass effect or midline shift. No hydrocephalus. Basilar cisterns
are patent.

Vascular: No hyperdense vessel.

Skull: No acute fracture or focal lesion.

Sinuses/Orbits: Paranasal sinuses and mastoid air cells are clear.
The orbits are unremarkable.

Other: None.
IMPRESSION: No acute intracranial abnormality.

## 2023-06-26 IMAGING — CT CT ABD-PELV W/ CM
2 of 4 series · 16 of 46 positions shown, 18 images · IV contrast (agent unspecified)
Comparison: NONE

CLINICAL DATA: Left lower quadrant abdominal pain.

EXAM:
CT ABDOMEN AND PELVIS WITH CONTRAST
TECHNIQUE: Multidetector CT imaging of the abdomen and pelvis was performed
using the standard protocol following bolus administration of
intravenous contrast.

[Series 3: a/p w/ 5mm · axial · 0.90mm/px · z∈[+705,+1155]mm · 13 of 98 slices shown, 15 images]
[im 4/98  soft-tissue]
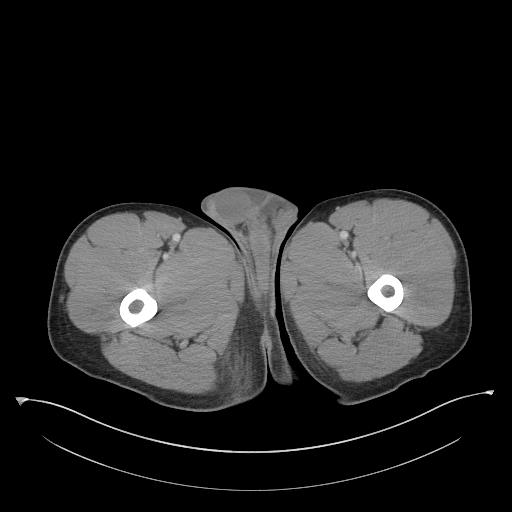
[im 4/98  bone]
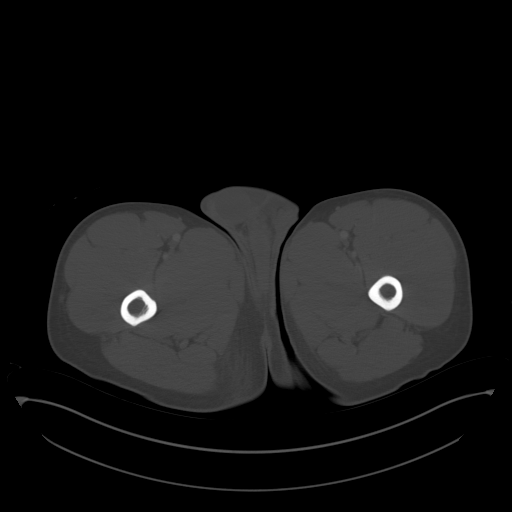
[im 12/98  soft-tissue]
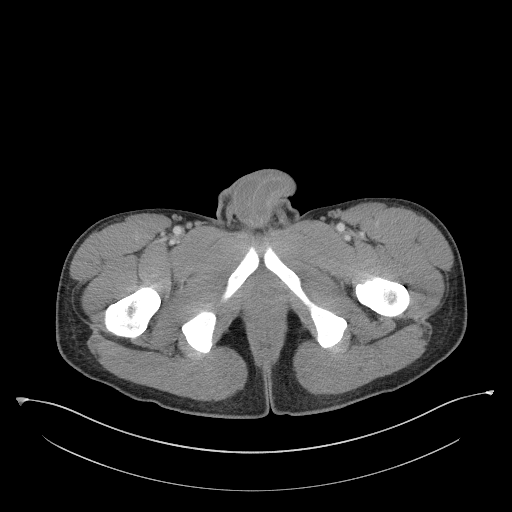
[im 19/98  soft-tissue]
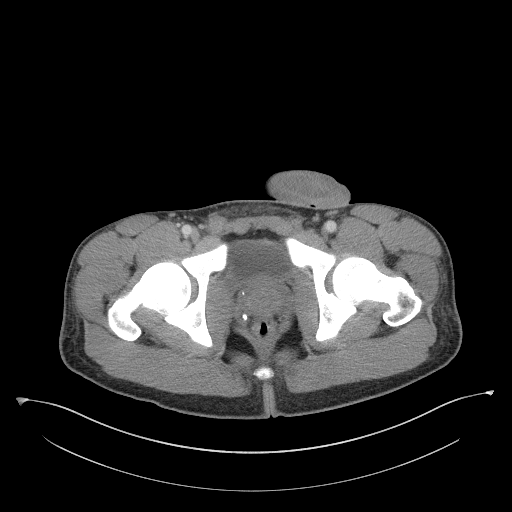
[im 27/98  soft-tissue]
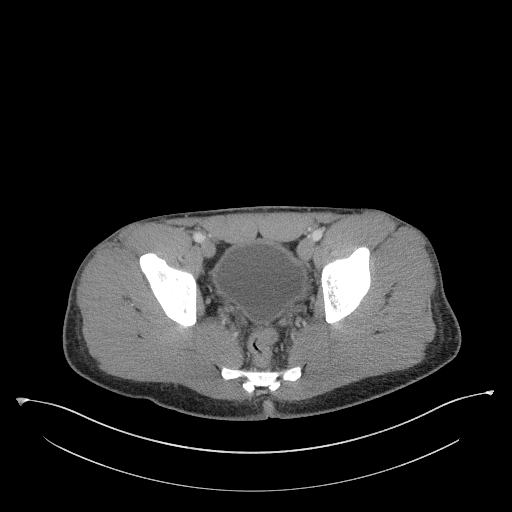
[im 34/98  soft-tissue]
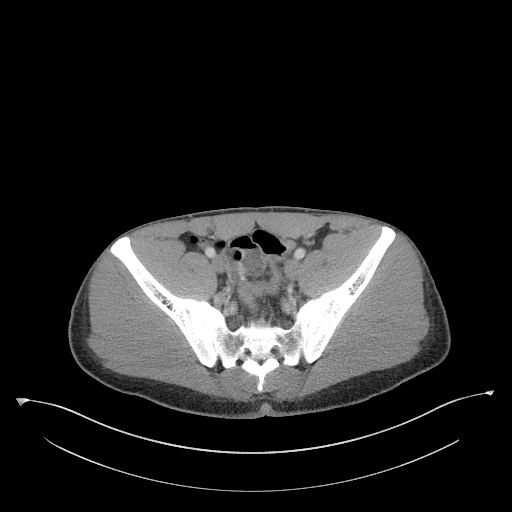
[im 42/98  soft-tissue]
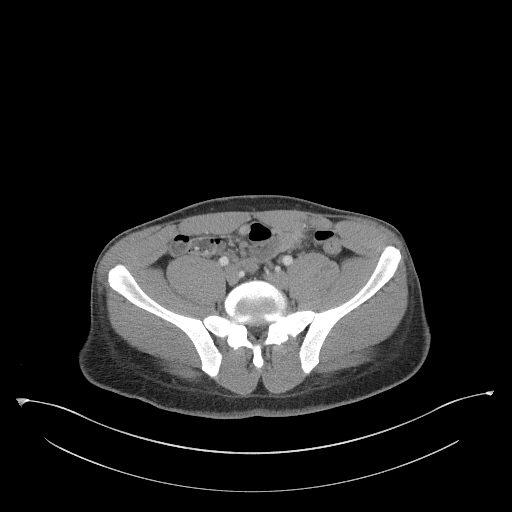
[im 49/98  soft-tissue]
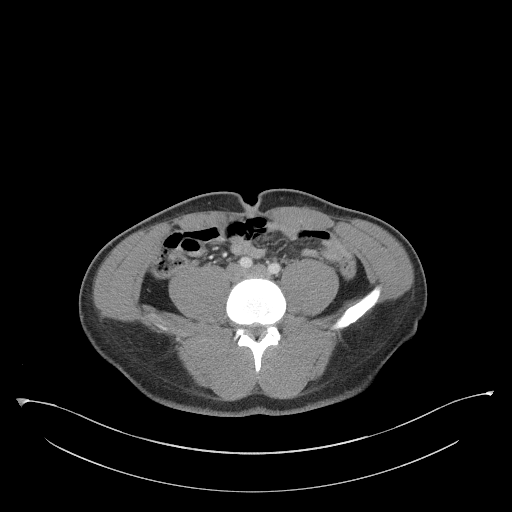
[im 56/98  soft-tissue]
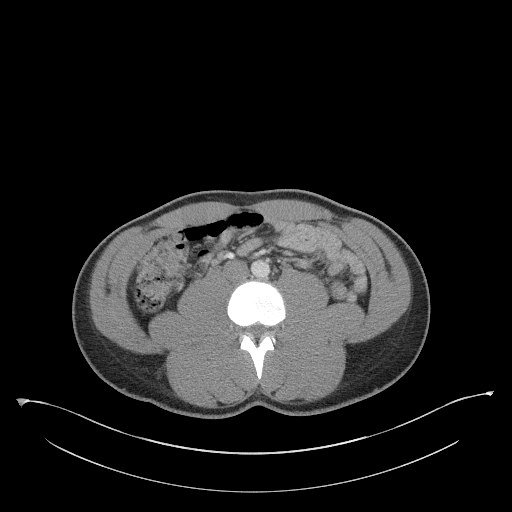
[im 64/98  soft-tissue]
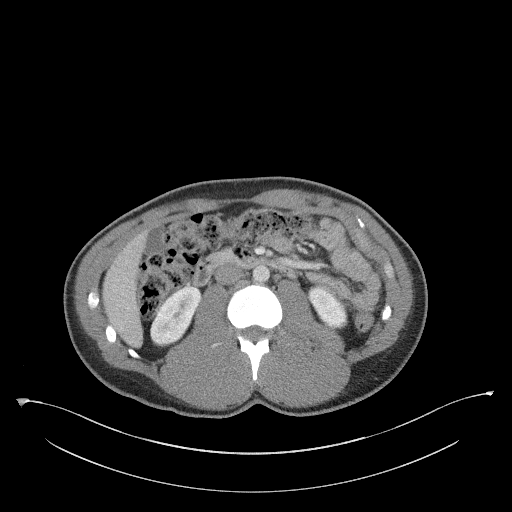
[im 64/98  bone]
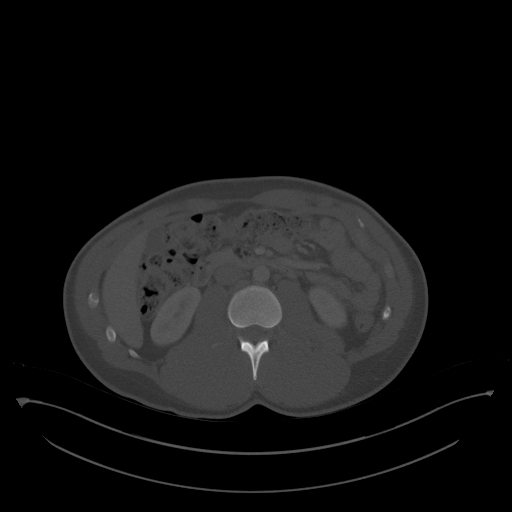
[im 71/98  soft-tissue]
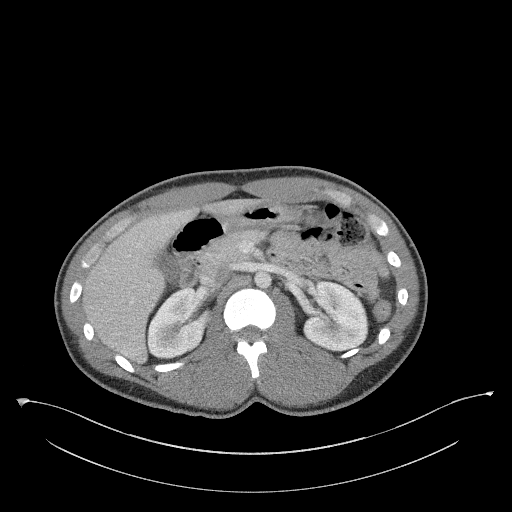
[im 79/98  soft-tissue]
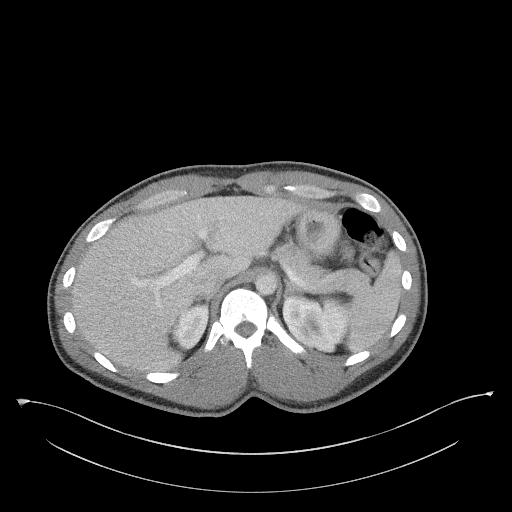
[im 86/98  soft-tissue]
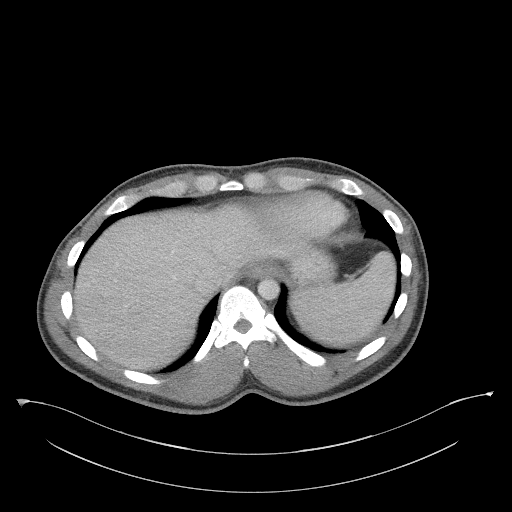
[im 94/98  soft-tissue]
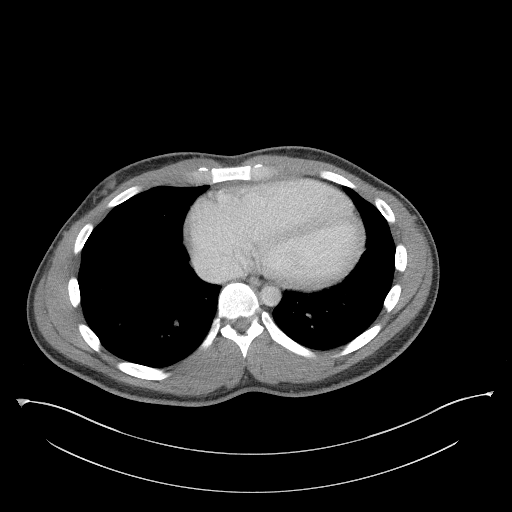

[Series 6: a/p w/ cor · coronal · 0.87mm/px · 3 of 128 slices shown]
[im 43/128  soft-tissue]
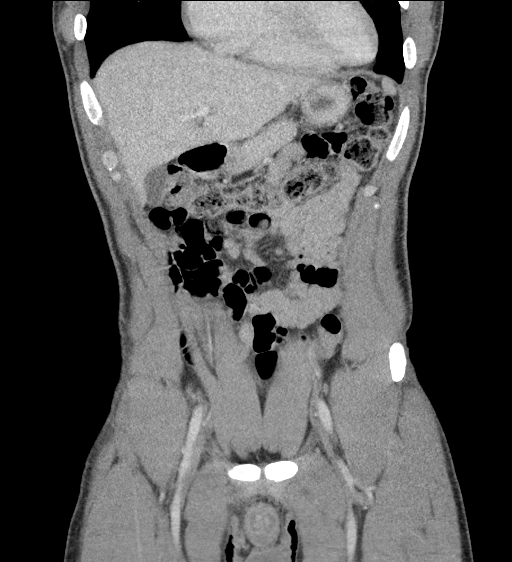
[im 57/128  soft-tissue]
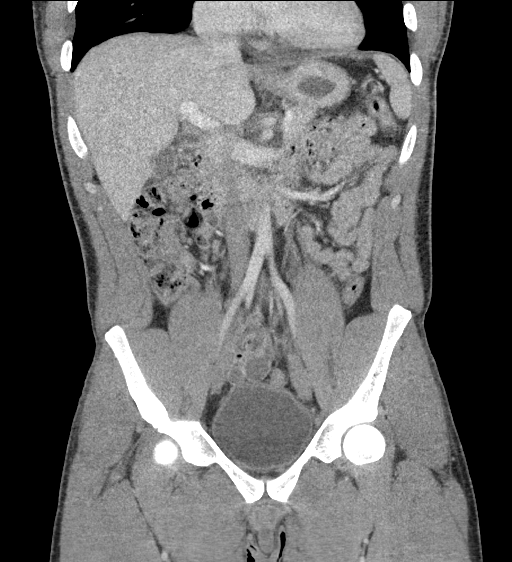
[im 71/128  soft-tissue]
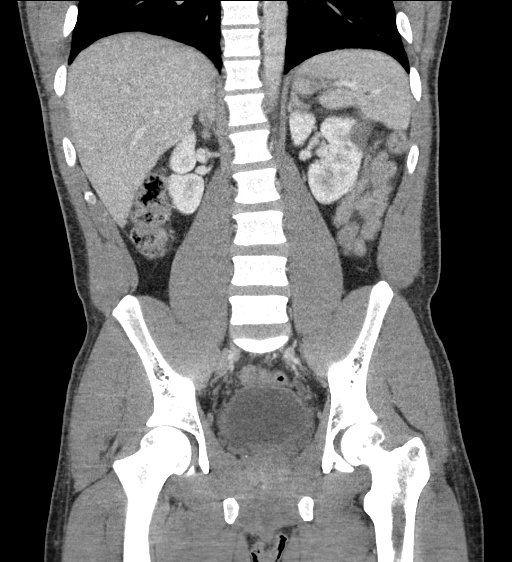

[16 of 46 positions shown; findings below may reference images not displayed]

RADIATION DOSE REDUCTION: This exam was performed according to the
departmental dose-optimization program which includes automated
exposure control, adjustment of the mA and/or kV according to
patient size and/or use of iterative reconstruction technique.

CONTRAST:  100mL OMNIPAQUE IOHEXOL 300 MG/ML  SOLN
FINDINGS: Lower chest: No acute abnormality. Incidental note made of
gynecomastia.

Hepatobiliary: No focal liver abnormality is seen. No gallstones,
gallbladder wall thickening, or biliary dilatation.

Pancreas: Unremarkable. No pancreatic ductal dilatation or
surrounding inflammatory changes.

Spleen: Normal in size without focal abnormality.

Adrenals/Urinary Tract: Adrenal glands are unremarkable. Kidneys are
normal, without renal calculi, focal lesion, or hydronephrosis.
Bladder is unremarkable.

Stomach/Bowel: Stomach is within normal limits. No evidence of bowel
wall thickening, distention, or inflammatory changes. Appendix is
normal.

Vascular/Lymphatic: No significant vascular findings are present. No
enlarged abdominal or pelvic lymph nodes.

Reproductive: Prostate is unremarkable.

Other: No abdominal wall hernia or abnormality. No abdominopelvic
ascites.

Musculoskeletal: No acute osseous abnormality. No aggressive osseous
lesion.
IMPRESSION: 1. No acute abdominal or pelvic pathology.
